# Patient Record
Sex: Female | Born: 2000 | Race: Black or African American | Hispanic: No | Marital: Single | Smoking: Never smoker
Health system: Southern US, Community
[De-identification: ages and names within clinical notes are randomized; demographics above are authoritative.]

## PROBLEM LIST (undated history)

## (undated) HISTORY — PX: CARDIAC ELECTROPHYSIOLOGY MAPPING AND ABLATION: SHX1292

---
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
## 2020-09-21 ENCOUNTER — Other Ambulatory Visit: Payer: Self-pay

## 2020-09-21 ENCOUNTER — Emergency Department (HOSPITAL_COMMUNITY)
Admission: EM | Admit: 2020-09-21 | Discharge: 2020-09-21 | Disposition: A | Discharge status: Home / Self Care | Payer: Medicaid Other | Attending: Emergency Medicine | Admitting: Emergency Medicine

## 2020-09-21 DIAGNOSIS — R000 Tachycardia, unspecified: Secondary | ICD-10-CM | POA: Diagnosis present

## 2020-09-21 DIAGNOSIS — Z20822 Contact with and (suspected) exposure to covid-19: Secondary | ICD-10-CM | POA: Insufficient documentation

## 2020-09-21 DIAGNOSIS — I471 Supraventricular tachycardia: Secondary | ICD-10-CM | POA: Insufficient documentation

## 2020-09-21 LAB — CBC WITH DIFFERENTIAL/PLATELET
Abs Immature Granulocytes: 0.01 10*3/uL (ref 0.00–0.07)
Basophils Absolute: 0 10*3/uL (ref 0.0–0.1)
Basophils Relative: 0 %
Eosinophils Absolute: 0.2 10*3/uL (ref 0.0–0.5)
Eosinophils Relative: 3 %
HCT: 30.6 % — ABNORMAL LOW (ref 36.0–46.0)
Hemoglobin: 10.2 g/dL — ABNORMAL LOW (ref 12.0–15.0)
Immature Granulocytes: 0 %
Lymphocytes Relative: 19 %
Lymphs Abs: 1.3 10*3/uL (ref 0.7–4.0)
MCH: 26.7 pg (ref 26.0–34.0)
MCHC: 33.3 g/dL (ref 30.0–36.0)
MCV: 80.1 fL (ref 80.0–100.0)
Monocytes Absolute: 0.9 10*3/uL (ref 0.1–1.0)
Monocytes Relative: 13 %
Neutro Abs: 4.6 10*3/uL (ref 1.7–7.7)
Neutrophils Relative %: 65 %
Platelets: 298 10*3/uL (ref 150–400)
RBC: 3.82 MIL/uL — ABNORMAL LOW (ref 3.87–5.11)
RDW: 14.4 % (ref 11.5–15.5)
WBC: 7 10*3/uL (ref 4.0–10.5)
nRBC: 0 % (ref 0.0–0.2)

## 2020-09-21 LAB — BASIC METABOLIC PANEL
Anion gap: 9 (ref 5–15)
BUN: 13 mg/dL (ref 6–20)
CO2: 23 mmol/L (ref 22–32)
Calcium: 8.9 mg/dL (ref 8.9–10.3)
Chloride: 107 mmol/L (ref 98–111)
Creatinine, Ser: 0.63 mg/dL (ref 0.44–1.00)
GFR, Estimated: >60 mL/min (ref >60)
Glucose, Bld: 89 mg/dL (ref 70–99)
Potassium: 3.6 mmol/L (ref 3.5–5.1)
Sodium: 139 mmol/L (ref 135–145)

## 2020-09-21 LAB — MAGNESIUM: Magnesium: 2 mg/dL (ref 1.7–2.4)

## 2020-09-21 LAB — I-STAT BETA HCG BLOOD, ED (MC, WL, AP ONLY): I-stat hCG, quantitative: <5 m[IU]/mL (ref <5)

## 2020-09-21 MED ORDER — ATENOLOL 25 MG PO TABS: 25.0000 mg | ORAL_TABLET | Freq: Every day | ORAL | 0 refills | Status: DC

## 2020-09-21 MED ORDER — ATENOLOL 25 MG PO TABS
25.0000 mg | ORAL_TABLET | Freq: Once | ORAL | Status: AC
  Administered 2020-09-21: 25 mg via ORAL
  Filled 2020-09-21: qty 1

## 2020-09-21 NOTE — ED Triage Notes (Addendum)
Pt arrived via ems due to SVT. Pt states she was pumping gas when she started to feel her heart race. Pt reports shob with no cp. Initial HR with ems 218

## 2020-09-21 NOTE — ED Provider Notes (Signed)
[facility] EMERGENCY DEPARTMENT Provider Note   CSN: [id number] Arrival date & time: 09/21/20  2035     History Chief Complaint  Patient presents with  . Tachycardia    Sabrina Ingram is a 20 y.o. female.  The history is provided by the patient and medical records.   Sabrina Ingram is a 20 y.o. female who presents to the Emergency Department complaining of palpitations. She has a history of SVT and is supposed to be on atenolol but has not taken the medication for about one week (forgot to take it).  This evening she was pumping gas when she felt palpitations with associated shortness of breath. She attempted vagal maneuvers without ability to convert herself. 911 was called. On EMS arrival her heart rate was found to be around 218. She was treated with adenocard, 6 mg followed by 12 mg. After the medication she states that her heart rate began to feel better. Her palpitations resolved. She denies any current symptoms on ED evaluation. She denies any recent illnesses. Three weeks ago she did have a covid exposure, but she tested negative one week ago. She denies any current symptoms.  She has not been vaccinated for covid 19.     No past medical history on file.  There are no problems to display for this patient.   No past surgical history on file.   OB History   No obstetric history on file.     No family history on file.     Home Medications Prior to Admission medications   Medication Sig Start Date End Date Taking? Authorizing Provider  penicillin v potassium (VEETID) 500 MG tablet Take 500 mg by mouth 4 (four) times daily.   Yes [provider]  atenolol (TENORMIN) 25 MG tablet Take 1 tablet (25 mg total) by mouth daily. 09/21/20   Tilden Fossa, MD    Allergies    Lactose intolerance (gi)  Review of Systems   Review of Systems  All other systems reviewed and are negative.   Physical Exam Updated Vital Signs BP 106/74   Pulse  (!) 113   Temp 98 F (36.7 C) (Temporal)   Resp 18   Ht 5\' 9"  (1.753 m)   Wt 67.6 kg   SpO2 100%   BMI 22.00 kg/m   Physical Exam Vitals and nursing note reviewed.  Constitutional:      Appearance: She is well-developed and well-nourished.  HENT:     Head: Normocephalic and atraumatic.  Cardiovascular:     Rate and Rhythm: Regular rhythm. Tachycardia present.     Heart sounds: No murmur heard.   Pulmonary:     Effort: Pulmonary effort is normal. No respiratory distress.     Breath sounds: Normal breath sounds.  Abdominal:     Palpations: Abdomen is soft.     Tenderness: There is no abdominal tenderness. There is no guarding or rebound.  Musculoskeletal:        General: No swelling, tenderness or edema.  Skin:    General: Skin is warm and dry.  Neurological:     Mental Status: She is alert and oriented to person, place, and time.  Psychiatric:        Mood and Affect: Mood and affect normal.        Behavior: Behavior normal.     ED Results / Procedures / Treatments   Labs (all labs ordered are listed, but only abnormal results are displayed) Labs Reviewed  CBC WITH  DIFFERENTIAL/PLATELET - Abnormal; Notable for the following components:      Result Value   RBC 3.82 (*)    Hemoglobin 10.2 (*)    HCT 30.6 (*)    All other components within normal limits  SARS CORONAVIRUS 2 (TAT 6-24 HRS)  BASIC METABOLIC PANEL  MAGNESIUM  I-STAT BETA HCG BLOOD, ED ([facility], [facility], [facility] ONLY)    EKG EKG Interpretation  Date/Time:  Monday September 21 2020 20:46:12 EST Ventricular Rate:  132 PR Interval:    QRS Duration: 88 QT Interval:  293 QTC Calculation: 435 R Axis:   92 Text Interpretation: Sinus tachycardia Borderline right axis deviation Confirmed by Tilden Fossa [phone]) on 09/21/2020 9:19:08 PM   Radiology No results found.  Procedures Procedures (including critical care time)  Medications Ordered in ED Medications  atenolol (TENORMIN) tablet 25 mg (25 mg Oral Given  09/21/20 2056)    ED Course  I have reviewed the triage vital signs and the nursing notes.  Pertinent labs & imaging results that were available during my care of the patient were reviewed by me and considered in my medical decision making (see chart for details).    MDM Rules/Calculators/A&P                          Patient with history of SVT here for evaluation of palpitations and setting of missing her atenolol for the last week. She did take her atenolol just prior to ED presentation. She converted to sinus tachycardia after EMS administered adenosine. During her ED stay she was tachycardic but remained asymptomatic. She was given a second dose of atenolol. Presentation is not consistent with PE, pneumonia, sepsis. Feel patient is stable for discharge home with outpatient follow-up and return precautions. Given her recent COVID exposure, will send a COVID-19 test. Final Clinical Impression(s) / ED Diagnoses Final diagnoses:  SVT (supraventricular tachycardia) (HCC)    Rx / DC Orders ED Discharge Orders         Ordered    atenolol (TENORMIN) 25 MG tablet  Daily        09/21/20 2207           Tilden Fossa, MD 09/21/20 2222

## 2020-09-22 LAB — SARS CORONAVIRUS 2 (TAT 6-24 HRS): SARS Coronavirus 2: NEGATIVE

## 2021-06-22 DIAGNOSIS — F411 Generalized anxiety disorder: Secondary | ICD-10-CM | POA: Diagnosis not present

## 2021-06-22 DIAGNOSIS — Z136 Encounter for screening for cardiovascular disorders: Secondary | ICD-10-CM | POA: Diagnosis not present

## 2021-06-22 DIAGNOSIS — N912 Amenorrhea, unspecified: Secondary | ICD-10-CM | POA: Diagnosis not present

## 2021-06-23 DIAGNOSIS — I471 Supraventricular tachycardia: Secondary | ICD-10-CM | POA: Diagnosis not present

## 2021-07-05 DIAGNOSIS — N912 Amenorrhea, unspecified: Secondary | ICD-10-CM | POA: Diagnosis not present

## 2021-07-05 DIAGNOSIS — Z136 Encounter for screening for cardiovascular disorders: Secondary | ICD-10-CM | POA: Diagnosis not present

## 2021-07-13 DIAGNOSIS — F410 Panic disorder [episodic paroxysmal anxiety] without agoraphobia: Secondary | ICD-10-CM | POA: Diagnosis not present

## 2021-07-13 DIAGNOSIS — N912 Amenorrhea, unspecified: Secondary | ICD-10-CM | POA: Diagnosis not present

## 2021-07-13 DIAGNOSIS — F321 Major depressive disorder, single episode, moderate: Secondary | ICD-10-CM | POA: Diagnosis not present

## 2021-07-13 DIAGNOSIS — F411 Generalized anxiety disorder: Secondary | ICD-10-CM | POA: Diagnosis not present

## 2021-07-13 DIAGNOSIS — G4700 Insomnia, unspecified: Secondary | ICD-10-CM | POA: Diagnosis not present

## 2021-07-13 DIAGNOSIS — F418 Other specified anxiety disorders: Secondary | ICD-10-CM | POA: Diagnosis not present

## 2021-07-13 DIAGNOSIS — Z0001 Encounter for general adult medical examination with abnormal findings: Secondary | ICD-10-CM | POA: Diagnosis not present

## 2021-07-15 DIAGNOSIS — E2839 Other primary ovarian failure: Secondary | ICD-10-CM | POA: Diagnosis not present

## 2021-07-15 DIAGNOSIS — N910 Primary amenorrhea: Secondary | ICD-10-CM | POA: Diagnosis not present

## 2021-07-22 DIAGNOSIS — E2839 Other primary ovarian failure: Secondary | ICD-10-CM | POA: Diagnosis not present

## 2021-09-09 DIAGNOSIS — N910 Primary amenorrhea: Secondary | ICD-10-CM | POA: Diagnosis not present

## 2021-09-09 DIAGNOSIS — Q520 Congenital absence of vagina: Secondary | ICD-10-CM | POA: Diagnosis not present

## 2021-09-09 DIAGNOSIS — E2839 Other primary ovarian failure: Secondary | ICD-10-CM | POA: Diagnosis not present

## 2021-09-09 DIAGNOSIS — Z9013 Acquired absence of bilateral breasts and nipples: Secondary | ICD-10-CM | POA: Diagnosis not present

## 2022-01-26 ENCOUNTER — Other Ambulatory Visit: Payer: Self-pay | Admitting: Nurse Practitioner

## 2022-01-26 DIAGNOSIS — I739 Peripheral vascular disease, unspecified: Secondary | ICD-10-CM

## 2022-02-03 ENCOUNTER — Other Ambulatory Visit: Payer: Medicaid Other

## 2022-02-11 ENCOUNTER — Ambulatory Visit
Admission: RE | Admit: 2022-02-11 | Discharge: 2022-02-11 | Disposition: A | Discharge status: Home / Self Care | Payer: Medicaid Other | Source: Ambulatory Visit | Attending: Nurse Practitioner | Admitting: Nurse Practitioner

## 2022-02-11 DIAGNOSIS — I739 Peripheral vascular disease, unspecified: Secondary | ICD-10-CM

## 2022-03-15 ENCOUNTER — Emergency Department (HOSPITAL_COMMUNITY): Payer: BC Managed Care – PPO

## 2022-03-15 ENCOUNTER — Emergency Department (HOSPITAL_COMMUNITY)
Admission: EM | Admit: 2022-03-15 | Discharge: 2022-03-15 | Disposition: A | Discharge status: Home / Self Care | Payer: BC Managed Care – PPO | Attending: Emergency Medicine | Admitting: Emergency Medicine

## 2022-03-15 ENCOUNTER — Encounter (HOSPITAL_COMMUNITY): Payer: Self-pay | Admitting: Emergency Medicine

## 2022-03-15 DIAGNOSIS — R002 Palpitations: Secondary | ICD-10-CM | POA: Diagnosis present

## 2022-03-15 DIAGNOSIS — I471 Supraventricular tachycardia: Secondary | ICD-10-CM | POA: Insufficient documentation

## 2022-03-15 DIAGNOSIS — D649 Anemia, unspecified: Secondary | ICD-10-CM | POA: Diagnosis not present

## 2022-03-15 DIAGNOSIS — R2990 Unspecified symptoms and signs involving the nervous system: Secondary | ICD-10-CM | POA: Diagnosis not present

## 2022-03-15 LAB — TROPONIN I (HIGH SENSITIVITY)
Troponin I (High Sensitivity): 14 ng/L (ref <18)
Troponin I (High Sensitivity): 18 ng/L — ABNORMAL HIGH (ref <18)

## 2022-03-15 LAB — I-STAT BETA HCG BLOOD, ED (MC, WL, AP ONLY): I-stat hCG, quantitative: <5 m[IU]/mL (ref <5)

## 2022-03-15 LAB — CBC
HCT: 35.8 % — ABNORMAL LOW (ref 36.0–46.0)
Hemoglobin: 11.1 g/dL — ABNORMAL LOW (ref 12.0–15.0)
MCH: 24.9 pg — ABNORMAL LOW (ref 26.0–34.0)
MCHC: 31 g/dL (ref 30.0–36.0)
MCV: 80.3 fL (ref 80.0–100.0)
Platelets: 313 10*3/uL (ref 150–400)
RBC: 4.46 MIL/uL (ref 3.87–5.11)
RDW: 14.2 % (ref 11.5–15.5)
WBC: 3.1 10*3/uL — ABNORMAL LOW (ref 4.0–10.5)
nRBC: 0 % (ref 0.0–0.2)

## 2022-03-15 LAB — BASIC METABOLIC PANEL
Anion gap: 11 (ref 5–15)
BUN: 10 mg/dL (ref 6–20)
CO2: 25 mmol/L (ref 22–32)
Calcium: 9.5 mg/dL (ref 8.9–10.3)
Chloride: 104 mmol/L (ref 98–111)
Creatinine, Ser: 0.62 mg/dL (ref 0.44–1.00)
GFR, Estimated: >60 mL/min (ref >60)
Glucose, Bld: 81 mg/dL (ref 70–99)
Potassium: 3.5 mmol/L (ref 3.5–5.1)
Sodium: 140 mmol/L (ref 135–145)

## 2022-03-15 MED ORDER — SODIUM CHLORIDE 0.9 % IV BOLUS
1000.0000 mL | Freq: Once | INTRAVENOUS | Status: AC
  Administered 2022-03-15: 1000 mL via INTRAVENOUS

## 2022-03-15 NOTE — Discharge Instructions (Signed)
Take your atenolol and iron every day.

## 2022-03-15 NOTE — ED Triage Notes (Signed)
Patient BIB [facility] from home for evaluation of shortness of breath, HR 250 on EMS arrival to home. Patient performed vagal maneuvers with EMS and HR reduced to 104. 20g saline lock in left AC. Shortness of breath resolved with change in heart rate. Patient is alert, oriented, and in no apparent distress at this time. Patient is prescribed atenolol but did not take it yesterday.

## 2022-03-15 NOTE — ED Provider Notes (Signed)
[facility] EMERGENCY DEPARTMENT Provider Note   CSN: [id number] Arrival date & time: 03/15/22  1540     History  Chief Complaint  Patient presents with   Palpitations    Sabrina Ingram is a 21 y.o. female.  Pt is a 21 year old female with a pmhx significant for SVT.  She was driving to work and felt herself go into SVT.  She called EMS and HR was 250.  She did some vagal maneuvers and HR decreased back to low 100s.  Pt feels much better now.  Pt tells me that she has had some vague numbness to her legs.  She has seen her pcp for this problem who did some blood work which showed a + ANA.  Pt was referred to rheumatology by her pcp, but said no one has ever called her for an appt.  Pcp did do ABIs which were negative.       Home Medications Prior to Admission medications   Medication Sig Start Date End Date Taking? Authorizing Provider  atenolol (TENORMIN) 25 MG tablet Take 1 tablet (25 mg total) by mouth daily. 09/21/20   Tilden Fossa, MD  penicillin v potassium (VEETID) 500 MG tablet Take 500 mg by mouth 4 (four) times daily.    [provider]      Allergies    Lactose intolerance (gi)    Review of Systems   Review of Systems  Cardiovascular:  Positive for palpitations.  All other systems reviewed and are negative.   Physical Exam Updated Vital Signs BP 101/63   Pulse 80   Temp 98.5 F (36.9 C) (Oral)   Resp (!) 21   SpO2 95%  Physical Exam Vitals and nursing note reviewed.  Constitutional:      Appearance: Normal appearance.  HENT:     Head: Normocephalic and atraumatic.     Right Ear: External ear normal.     Left Ear: External ear normal.     Nose: Nose normal.     Mouth/Throat:     Mouth: Mucous membranes are moist.     Pharynx: Oropharynx is clear.  Eyes:     Extraocular Movements: Extraocular movements intact.     Conjunctiva/sclera: Conjunctivae normal.     Pupils: Pupils are equal, round, and reactive to light.   Cardiovascular:     Rate and Rhythm: Normal rate and regular rhythm.     Pulses: Normal pulses.     Heart sounds: Normal heart sounds.  Pulmonary:     Effort: Pulmonary effort is normal.     Breath sounds: Normal breath sounds.  Abdominal:     General: Abdomen is flat. Bowel sounds are normal.     Palpations: Abdomen is soft.  Musculoskeletal:        General: Normal range of motion.     Cervical back: Normal range of motion and neck supple.  Skin:    General: Skin is warm.     Capillary Refill: Capillary refill takes less than 2 seconds.  Neurological:     General: No focal deficit present.     Mental Status: She is alert and oriented to person, place, and time.  Psychiatric:        Mood and Affect: Mood normal.        Behavior: Behavior normal.     ED Results / Procedures / Treatments   Labs (all labs ordered are listed, but only abnormal results are displayed) Labs Reviewed  CBC -  Abnormal; Notable for the following components:      Result Value   WBC 3.1 (*)    Hemoglobin 11.1 (*)    HCT 35.8 (*)    MCH 24.9 (*)    All other components within normal limits  TROPONIN I (HIGH SENSITIVITY) - Abnormal; Notable for the following components:   Troponin I (High Sensitivity) 18 (*)    All other components within normal limits  BASIC METABOLIC PANEL  I-STAT BETA HCG BLOOD, ED ([facility], [facility], [facility] ONLY)  TROPONIN I (HIGH SENSITIVITY)    EKG EKG Interpretation  Date/Time:  Tuesday March 15 2022 09:37:16 EDT Ventricular Rate:  101 PR Interval:  190 QRS Duration: 86 QT Interval:  318 QTC Calculation: 412 R Axis:   72 Text Interpretation: Sinus tachycardia Otherwise normal ECG When compared with ECG of 21-Sep-2020 20:46, PREVIOUS ECG IS PRESENT Since last tracing rate slower Confirmed by Jacalyn Lefevre [phone]) on 03/15/2022 2:17:02 PM  Radiology DG Chest 2 View  Result Date: 03/15/2022 CLINICAL DATA:  Chest pain EXAM: CHEST - 2 VIEW COMPARISON:  06/14/2018 FINDINGS: Normal  heart size, mediastinal contours, and pulmonary vascularity. Minimal chronic peribronchial thickening unchanged. Lungs clear. No pulmonary infiltrate, pleural effusion or pneumothorax. Osseous structures unremarkable. IMPRESSION: No acute abnormalities. Electronically Signed   By: Ulyses Southward M.D.   On: 03/15/2022 10:11    Procedures Procedures    Medications Ordered in ED Medications  sodium chloride 0.9 % bolus 1,000 mL (0 mLs Intravenous Stopped 03/15/22 1356)    ED Course/ Medical Decision Making/ A&P                           Medical Decision Making Amount and/or Complexity of Data Reviewed Labs: ordered. Radiology: ordered.   This patient presents to the ED for concern of palpitations, this involves an extensive number of treatment options, and is a complaint that carries with it a high risk of complications and morbidity.  The differential diagnosis includes svt, electrolyte abn, pregnancy   Co morbidities that complicate the patient evaluation  svt   Additional history obtained:  Additional history obtained from epic chart review    Lab Tests:  I Ordered, and personally interpreted labs.  The pertinent results include:  cbc with wbc 3.1, hgb 11.1 (last hgb 10); preg <5   Imaging Studies ordered:  I ordered imaging studies including CXR  I independently visualized and interpreted imaging which showed  IMPRESSION:  No acute abnormalities.   I agree with the radiologist interpretation   Cardiac Monitoring:  The patient was maintained on a cardiac monitor.  I personally viewed and interpreted the cardiac monitored which showed an underlying rhythm of: st.  Pt's hr is now down to the 80s.   Medicines ordered and prescription drug management:  I ordered medication including ivfs  for svt  Reevaluation of the patient after these medicines showed that the patient improved I have reviewed the patients home medicines and have made adjustments as  needed    Critical Interventions:  ivfs    Problem List / ED Course:  SVT:  resolved with vagal maneuvers.  The pt's HR is now in the mid-80s.  She admits to forgetting to take her atenolol yesterday. Pt said she has a cardiology appt in August. +ANA:  pt re-referred to rheumatology   Reevaluation:  After the interventions noted above, I reevaluated the patient and found that they have :improved   Social Determinants of  Health:  Lives at home   Dispostion:  After consideration of the diagnostic results and the patients response to treatment, I feel that the patent would benefit from discharge with outpatient f/u.          Final Clinical Impression(s) / ED Diagnoses Final diagnoses:  SVT (supraventricular tachycardia) (HCC)  Anemia, unspecified type    Rx / DC Orders ED Discharge Orders          Ordered    Ambulatory referral to Rheumatology        03/15/22 1350              Jacalyn Lefevre, MD 03/15/22 1423

## 2022-03-16 ENCOUNTER — Emergency Department (HOSPITAL_COMMUNITY): Payer: BC Managed Care – PPO

## 2022-03-16 ENCOUNTER — Emergency Department (HOSPITAL_COMMUNITY)
Admission: EM | Admit: 2022-03-16 | Discharge: 2022-03-16 | Disposition: A | Discharge status: Home / Self Care | Payer: BC Managed Care – PPO | Attending: Emergency Medicine | Admitting: Emergency Medicine

## 2022-03-16 ENCOUNTER — Other Ambulatory Visit: Payer: Self-pay

## 2022-03-16 DIAGNOSIS — R2990 Unspecified symptoms and signs involving the nervous system: Secondary | ICD-10-CM | POA: Diagnosis not present

## 2022-03-16 DIAGNOSIS — R202 Paresthesia of skin: Secondary | ICD-10-CM | POA: Diagnosis present

## 2022-03-16 LAB — COMPREHENSIVE METABOLIC PANEL
ALT: 12 U/L (ref 0–44)
AST: 20 U/L (ref 15–41)
Albumin: 3.8 g/dL (ref 3.5–5.0)
Alkaline Phosphatase: 67 U/L (ref 38–126)
Anion gap: 8 (ref 5–15)
BUN: 8 mg/dL (ref 6–20)
CO2: 25 mmol/L (ref 22–32)
Calcium: 9.1 mg/dL (ref 8.9–10.3)
Chloride: 106 mmol/L (ref 98–111)
Creatinine, Ser: 0.59 mg/dL (ref 0.44–1.00)
GFR, Estimated: >60 mL/min (ref >60)
Glucose, Bld: 93 mg/dL (ref 70–99)
Potassium: 3.6 mmol/L (ref 3.5–5.1)
Sodium: 139 mmol/L (ref 135–145)
Total Bilirubin: 0.2 mg/dL — ABNORMAL LOW (ref 0.3–1.2)
Total Protein: 7.7 g/dL (ref 6.5–8.1)

## 2022-03-16 LAB — PROTIME-INR
INR: 1.1 (ref 0.8–1.2)
Prothrombin Time: 14.3 seconds (ref 11.4–15.2)

## 2022-03-16 LAB — I-STAT CHEM 8, ED
BUN: 5 mg/dL — ABNORMAL LOW (ref 6–20)
Calcium, Ion: 1.14 mmol/L — ABNORMAL LOW (ref 1.15–1.40)
Chloride: 108 mmol/L (ref 98–111)
Creatinine, Ser: 0.4 mg/dL — ABNORMAL LOW (ref 0.44–1.00)
Glucose, Bld: 76 mg/dL (ref 70–99)
HCT: 29 % — ABNORMAL LOW (ref 36.0–46.0)
Hemoglobin: 9.9 g/dL — ABNORMAL LOW (ref 12.0–15.0)
Potassium: 3.3 mmol/L — ABNORMAL LOW (ref 3.5–5.1)
Sodium: 143 mmol/L (ref 135–145)
TCO2: 22 mmol/L (ref 22–32)

## 2022-03-16 LAB — APTT: aPTT: 30 seconds (ref 24–36)

## 2022-03-16 LAB — DIFFERENTIAL
Abs Immature Granulocytes: 0.01 10*3/uL (ref 0.00–0.07)
Basophils Absolute: 0 10*3/uL (ref 0.0–0.1)
Basophils Relative: 1 %
Eosinophils Absolute: 0.2 10*3/uL (ref 0.0–0.5)
Eosinophils Relative: 5 %
Immature Granulocytes: 0 %
Lymphocytes Relative: 30 %
Lymphs Abs: 1.2 10*3/uL (ref 0.7–4.0)
Monocytes Absolute: 0.5 10*3/uL (ref 0.1–1.0)
Monocytes Relative: 14 %
Neutro Abs: 1.9 10*3/uL (ref 1.7–7.7)
Neutrophils Relative %: 50 %

## 2022-03-16 LAB — CBC
HCT: 32.7 % — ABNORMAL LOW (ref 36.0–46.0)
Hemoglobin: 9.9 g/dL — ABNORMAL LOW (ref 12.0–15.0)
MCH: 24.6 pg — ABNORMAL LOW (ref 26.0–34.0)
MCHC: 30.3 g/dL (ref 30.0–36.0)
MCV: 81.1 fL (ref 80.0–100.0)
Platelets: 272 10*3/uL (ref 150–400)
RBC: 4.03 MIL/uL (ref 3.87–5.11)
RDW: 14.4 % (ref 11.5–15.5)
WBC: 3.8 10*3/uL — ABNORMAL LOW (ref 4.0–10.5)
nRBC: 0 % (ref 0.0–0.2)

## 2022-03-16 LAB — CBG MONITORING, ED
Glucose-Capillary: 91 mg/dL (ref 70–99)
Glucose-Capillary: 44 mg/dL — CL (ref 70–99)
Glucose-Capillary: 48 mg/dL — ABNORMAL LOW (ref 70–99)
Glucose-Capillary: 67 mg/dL — ABNORMAL LOW (ref 70–99)

## 2022-03-16 LAB — I-STAT BETA HCG BLOOD, ED (MC, WL, AP ONLY): I-stat hCG, quantitative: <5 m[IU]/mL (ref <5)

## 2022-03-16 LAB — ETHANOL: Alcohol, Ethyl (B): <10 mg/dL (ref <10)

## 2022-03-16 MED ORDER — IOHEXOL 350 MG/ML SOLN
50.0000 mL | Freq: Once | INTRAVENOUS | Status: AC | PRN
  Administered 2022-03-16: 50 mL via INTRAVENOUS

## 2022-03-16 MED ORDER — GADOBUTROL 1 MMOL/ML IV SOLN
7.0000 mL | Freq: Once | INTRAVENOUS | Status: AC | PRN
  Administered 2022-03-16: 7 mL via INTRAVENOUS

## 2022-03-16 MED ORDER — SODIUM CHLORIDE 0.9% FLUSH: 3.0000 mL | Freq: Once | INTRAVENOUS | Status: DC

## 2022-03-16 NOTE — Consult Note (Signed)
Neurology Consultation  Reason for Consult: Code stroke for right-sided numbness Referring Physician: Dr. Gwyneth Sprout  CC: Code stroke with right-sided numbness in the head, face, arm and leg  History is obtained from: Chart review, patient  HPI: Valincia Touch is a 21 y.o. female past medical history of PSVT, anxiety, Raynaud's phenomenon, peripheral vascular disease, oligomenorrhea, presenting for evaluation of sudden onset of heaviness and numbness that started in the right head and proceeded to involve the whole right hemibody.  She works in an office.  Reports she was at her desk when she had a sudden onset of right head heaviness and numbness which then went on to include the arm and leg.  No history of strokes-personal or family. Grandfather had a heart attack at the age of 99.  No bleeding or clotting disorders in the family. She was seen by her primary care provider for ankle swelling and tingling and numbness in her fingertips and toes and was worked up-work-up revealed positive ANA-was referred to rheumatology but not seen yet. Denies any current visual symptoms, headache, chest pain, shortness of breath.  PMH including SVT on atenolol 25mg  daily. She was seen in the ED yesterday after an episode of SVT and nonspecific leg numbness.    LKW: 1245 IV thrombolysis given?: no, less likely stroke, low NIH Premorbid modified Rankin scale (mRS): 0  ROS: Full ROS was performed and is negative except as noted in the HPI.   No past medical history on file.  Family history Heart attack in grandfather in his 97s.    Social History:   has no history on file for tobacco use, alcohol use, and drug use.   Medications  Current Facility-Administered Medications:    sodium chloride flush (NS) 0.9 % injection 3 mL, 3 mL, Intravenous, Once, Plunkett, Whitney, MD  Current Outpatient Medications:    atenolol (TENORMIN) 25 MG tablet, Take 1 tablet (25 mg total) by mouth daily.,  Disp: 30 tablet, Rfl: 0   penicillin v potassium (VEETID) 500 MG tablet, Take 500 mg by mouth 4 (four) times daily., Disp: , Rfl:    Exam: VSS General: Awake alert in no distress HD: Normocephalic atraumatic Lungs clear Cardiovascular: Regular rhythm Abdomen nondistended nontender Extremities warm well perfused Neurologic exam Awake alert oriented x3 No aphasia No dysarthria Cranial nerves II to XII intact with the exception of diminished sensation to light touch in the V1 and V3 territory on the right, normal on the left, intact facial symmetry, intact hearing, palate and tongue midline. Motor examination with no drift in any fours Sensory examination with diminished sensation on the right arm and leg compared to the left. Coordination with no dysmetria NIHSS  Stroke scale-1  Labs I have reviewed labs in epic and the results pertinent to this consultation are: CBC    Component Value Date/Time   WBC 3.8 (L) 03/16/2022 1402   RBC 4.03 03/16/2022 1402   HGB 9.9 (L) 03/16/2022 1402   HCT 32.7 (L) 03/16/2022 1402   PLT 272 03/16/2022 1402   MCV 81.1 03/16/2022 1402   MCH 24.6 (L) 03/16/2022 1402   MCHC 30.3 03/16/2022 1402   RDW 14.4 03/16/2022 1402   LYMPHSABS 1.2 03/16/2022 1402   MONOABS 0.5 03/16/2022 1402   EOSABS 0.2 03/16/2022 1402   BASOSABS 0.0 03/16/2022 1402   CMP     Component Value Date/Time   NA 139 03/16/2022 1402   K 3.6 03/16/2022 1402   CL 106 03/16/2022 1402   CO2  25 03/16/2022 1402   GLUCOSE 93 03/16/2022 1402   BUN 8 03/16/2022 1402   CREATININE 0.59 03/16/2022 1402   CALCIUM 9.1 03/16/2022 1402   PROT 7.7 03/16/2022 1402   ALBUMIN 3.8 03/16/2022 1402   AST 20 03/16/2022 1402   ALT 12 03/16/2022 1402   ALKPHOS 67 03/16/2022 1402   BILITOT 0.2 (L) 03/16/2022 1402   GFRNONAA >60 03/16/2022 1402   GFRAA NOT CALCULATED 02/14/2019 0021      Imaging I have reviewed the images obtained:  CT-head: No acute changes CTA head and neck: No LVO  or vascular malformation CTV: No evidence of dural venous sinus thrombus  MRI examination of the brain-ordered and pending  Assessment: 21 year old woman with above past medical history presenting for sudden onset of right-sided head heaviness, numbness on the right face arm and leg with no headache preceding or after.  Has a history of PSVT, anxiety and Raynaud's phenomenon. Has been worked up as an outpatient for some joint related issues as well as numbness of fingers and has a positive ANA-unclear if has a definitive lupus diagnosis. Given the hypercoagulability associated with autoimmune disorders-to rule out any kind of clotting, I proceeded with ordering vascular imaging for evaluation of the arteries as well as dural venous sinuses to rule out dural venous sinus thrombus. CT head, CT angiography head and neck and CT venogram are negative. She would benefit from a MRI of the head to rule out a structural lesion. Differentials at this time-stroke is very low in the differentials, complex migraine versus anxiety causing paresthesias is more likely.  Recommendations: Stat MRI of the brain  Addendum Stat MRI of the brain within limitations of artifact from braces negative for acute process.  Personally reviewed. Needs outpatient rheumatology and neurology follow-up. Can treat symptomatic head heaviness with migraine cocktail. Also of note-CBG on arrival was 44.  Symptoms could simply reflect sequela of hypoglycemia.  Plan discussed with Luther Hearing PA-C, EDP  No further inpatient work-up at this time  -- Milon Dikes, MD Neurologist [facility] Pager: [phone]

## 2022-03-16 NOTE — ED Provider Notes (Signed)
[facility] EMERGENCY DEPARTMENT Provider Note   CSN: [id number] Arrival date & time: 03/16/22  1358     History  Chief Complaint  Patient presents with   Code Stroke    Sabrina Ingram is a 21 y.o. female. I received this patient in handoff, however the previous provider has not had a chance to lay eyes on this patient due to the patient being declared a code stroke. At time of my handoff patient with unremarkable CT Head, CTA head / neck. Pending MRI.  History: This is a female with past medical history significant for PSVT, anxiety, Raynaud's phenomenon.  Additionally with recent previous diagnosis of positive ANA.  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  Patient was seen evaluated yesterday for SVT, with some vague neurologic symptoms at that time as well which she reports are resolved.  She reports that when she was sitting at work this morning she had not had very much to eat, and had some coffee to drink.  She reports that coffee often makes her feel very odd.  We have obtained CT head, CT angio head and neck, CT venogram as well as MR with and without on recommendations from neurology.  On my evaluation of patient she reports complete resolution of symptoms and reports that she feels overall well.  She denies any headache, vision changes, numbness, tingling, facial droop, confusion, dizziness, lightheadedness.  She reports that the right sided heaviness she felt earlier has completely resolved.  She denies any trauma or injury to the head.  No previous history of stroke.  No previous history of DVT or PE but some concern for hypercoagulability in context of possible lupus.  HPI     Home Medications Prior to Admission medications   Medication Sig Start Date End Date Taking? Authorizing Provider  atenolol (TENORMIN) 25 MG tablet Take 1 tablet (25 mg total) by mouth daily. 09/21/20   Tilden Fossa, MD  penicillin v potassium  (VEETID) 500 MG tablet Take 500 mg by mouth 4 (four) times daily.    [provider]      Allergies    Lactose intolerance (gi)    Review of Systems   Review of Systems  Neurological:  Positive for numbness.  All other systems reviewed and are negative.   Physical Exam Updated Vital Signs BP 105/66   Pulse 78   Resp (!) 21   Ht 5\' 9"  (1.753 m)   Wt 70 kg   SpO2 100%   BMI 22.79 kg/m  Physical Exam Vitals and nursing note reviewed.  Constitutional:      General: She is not in acute distress.    Appearance: Normal appearance.  HENT:     Head: Normocephalic and atraumatic.  Eyes:     General:        Right eye: No discharge.        Left eye: No discharge.  Cardiovascular:     Rate and Rhythm: Normal rate and regular rhythm.     Heart sounds: No murmur heard.    No friction rub. No gallop.  Pulmonary:     Effort: Pulmonary effort is normal.     Breath sounds: Normal breath sounds.  Abdominal:     General: Bowel sounds are normal.     Palpations: Abdomen is soft.  Skin:    General: Skin is warm and dry.     Capillary Refill: Capillary refill takes less than 2 seconds.  Neurological:     Mental Status: She is alert and oriented to person, place, and time.     Comments: Cranial nerves II through XII grossly intact.  Intact finger-nose, intact heel-to-shin.  Romberg negative, gait normal.  Alert and oriented x3.  Moves all 4 limbs spontaneously, normal coordination.  No pronator drift.  Intact strength 5 out of 5 bilateral upper and lower extremities.    Psychiatric:        Mood and Affect: Mood normal.        Behavior: Behavior normal.     ED Results / Procedures / Treatments   Labs (all labs ordered are listed, but only abnormal results are displayed) Labs Reviewed  CBC - Abnormal; Notable for the following components:      Result Value   WBC 3.8 (*)    Hemoglobin 9.9 (*)    HCT 32.7 (*)    MCH 24.6 (*)    All other components within normal limits   COMPREHENSIVE METABOLIC PANEL - Abnormal; Notable for the following components:   Total Bilirubin 0.2 (*)    All other components within normal limits  I-STAT CHEM 8, ED - Abnormal; Notable for the following components:   Potassium 3.3 (*)    BUN 5 (*)    Creatinine, Ser 0.40 (*)    Calcium, Ion 1.14 (*)    Hemoglobin 9.9 (*)    HCT 29.0 (*)    All other components within normal limits  CBG MONITORING, ED - Abnormal; Notable for the following components:   Glucose-Capillary 48 (*)    All other components within normal limits  CBG MONITORING, ED - Abnormal; Notable for the following components:   Glucose-Capillary 44 (*)    All other components within normal limits  CBG MONITORING, ED - Abnormal; Notable for the following components:   Glucose-Capillary 67 (*)    All other components within normal limits  PROTIME-INR  APTT  DIFFERENTIAL  ETHANOL  CBG MONITORING, ED  I-STAT BETA HCG BLOOD, ED ([facility], [facility], [facility] ONLY)    EKG None  Radiology MR BRAIN W WO CONTRAST  Result Date: 03/16/2022 EXAM: MRI HEAD WITHOUT AND WITH CONTRAST TECHNIQUE: Multiplanar, multiecho pulse sequences of the brain and surrounding structures were obtained without and with intravenous contrast. CONTRAST:  75mL GADAVIST GADOBUTROL 1 MMOL/ML IV SOLN COMPARISON:  None Available. FINDINGS: Limited study due to artifact from the patient's braces. A large portion of the anterior frontal lobes, basal ganglia, brainstem, anterior temporal lobes, and posterior fossa is obscured on diffusion-weighted and susceptibility weighted imaging. Within this limitation: Brain: No visible evidence of acute intracranial abnormality within limitation described above. No visible acute hemorrhage, mass lesion, midline shift, hydrocephalus, or extra-axial fluid collection. No visible pathologic enhancement. Visualized dural venous sinuses are patent. Vascular: Major arterial flow voids are maintained at the skull base. Skull and upper cervical  spine: Normal marrow signal. Sinuses/Orbits: Limited visualization. Opacified right frontal sinus. No acute orbital findings. Other: No mastoid effusions. IMPRESSION: Limited study due to artifact from the patient's braces without visible acute abnormality. Electronically Signed   By: Feliberto Harts M.D.   On: 03/16/2022 15:21   CT HEAD CODE STROKE WO CONTRAST  Result Date: 03/16/2022 CLINICAL DATA:  Code stroke. Neuro deficit, acute, stroke suspected. EXAM: CT ANGIOGRAPHY HEAD AND NECK TECHNIQUE: Multidetector CT imaging of the head and neck was performed using the standard protocol during bolus administration of intravenous contrast. Multiplanar CT image reconstructions and MIPs were obtained to evaluate the  vascular anatomy. Carotid stenosis measurements (when applicable) are obtained utilizing NASCET criteria, using the distal internal carotid diameter as the denominator. RADIATION DOSE REDUCTION: This exam was performed according to the departmental dose-optimization program which includes automated exposure control, adjustment of the mA and/or kV according to patient size and/or use of iterative reconstruction technique. CONTRAST:  Administered contrast not known at this time. COMPARISON:  No pertinent prior exams available for comparison. FINDINGS: CT HEAD FINDINGS Brain: Cerebral volume is normal. There is no acute intracranial hemorrhage. No demarcated cortical infarct. No extra-axial fluid collection. No evidence of an intracranial mass. No midline shift. Vascular: No hyperdense vessel. Skull: No fracture or aggressive osseous lesion. Sinuses: Opacification the right frontal sinus. Frothy secretions within the right frontoethmoidal recess. Minimal mucosal thickening within the right maxillary sinus. Orbits: No orbital mass or acute orbital finding. ASPECTS (Alberta Stroke Program Early CT Score) - Ganglionic level infarction (caudate, lentiform nuclei, internal capsule, insula, M1-M3 cortex): 7 -  Supraganglionic infarction (M4-M6 cortex): 3 Total score (0-10 with 10 being normal): 10 These results were communicated to Dr. Arora at 2:27 pmon 7/12/2023by text page via [facility] messaging system. Review of the MIP images confirms the above findings CTA NECK FINDINGS Aortic arch: Standard aortic branching. The visualized aortic arch is normal in caliber. No hemodynamically significant innominate or proximal subclavian artery stenosis. Right carotid system: CCA and ICA patent within the neck without stenosis or evidence of dissection. Left carotid system: CCA and ICA patent within the neck without stenosis or evidence of dissection. Vertebral arteries: Vertebral arteries patent within the neck without stenosis or evidence of dissection. Skeleton: No acute bony abnormality or aggressive osseous lesion. Other neck: No neck mass or cervical lymphadenopathy. Upper chest: No consolidation within the imaged lung apices. Review of the MIP images confirms the above findings CTA HEAD FINDINGS Anterior circulation: The intracranial internal carotid arteries are patent. The M1 middle cerebral arteries are patent. No M2 proximal branch occlusion or high-grade proximal stenosis. The anterior cerebral arteries are patent. No intracranial aneurysm is identified. Posterior circulation: The intracranial vertebral arteries are patent. The basilar artery is patent. The posterior cerebral arteries are patent. A small right posterior communicating artery is present. The left posterior communicating artery is diminutive or absent. Venous sinuses: Within the limitations of contrast timing, no convincing thrombus. Anatomic variants: As described. Review of the MIP images confirms the above findings No emergent large vessel occlusion identified. These results were communicated to Dr. Wilford Corner at 2:39 pm on 7/12/2023by text page via the [facility] messaging system. IMPRESSION: CT head: 1. No evidence of acute intracranial abnormality. 2.  Paranasal sinus disease, as described. CTA neck: The common carotid, internal carotid and vertebral arteries are patent within the neck without stenosis or evidence of dissection. CTA head: No intracranial large vessel occlusion or proximal high-grade arterial stenosis. Electronically Signed   By: Jackey Loge D.O.   On: 03/16/2022 14:40   CT ANGIO HEAD NECK W WO CM (CODE STROKE)  Result Date: 03/16/2022 CLINICAL DATA:  Code stroke. Neuro deficit, acute, stroke suspected. EXAM: CT ANGIOGRAPHY HEAD AND NECK TECHNIQUE: Multidetector CT imaging of the head and neck was performed using the standard protocol during bolus administration of intravenous contrast. Multiplanar CT image reconstructions and MIPs were obtained to evaluate the vascular anatomy. Carotid stenosis measurements (when applicable) are obtained utilizing NASCET criteria, using the distal internal carotid diameter as the denominator. RADIATION DOSE REDUCTION: This exam was performed according to the departmental dose-optimization program which includes  automated exposure control, adjustment of the mA and/or kV according to patient size and/or use of iterative reconstruction technique. CONTRAST:  Administered contrast not known at this time. COMPARISON:  No pertinent prior exams available for comparison. FINDINGS: CT HEAD FINDINGS Brain: Cerebral volume is normal. There is no acute intracranial hemorrhage. No demarcated cortical infarct. No extra-axial fluid collection. No evidence of an intracranial mass. No midline shift. Vascular: No hyperdense vessel. Skull: No fracture or aggressive osseous lesion. Sinuses: Opacification the right frontal sinus. Frothy secretions within the right frontoethmoidal recess. Minimal mucosal thickening within the right maxillary sinus. Orbits: No orbital mass or acute orbital finding. ASPECTS (Alberta Stroke Program Early CT Score) - Ganglionic level infarction (caudate, lentiform nuclei, internal capsule, insula,  M1-M3 cortex): 7 - Supraganglionic infarction (M4-M6 cortex): 3 Total score (0-10 with 10 being normal): 10 These results were communicated to Dr. Wilford CornerArora at 2:27 pmon 7/12/2023by text page via the [facility] messaging system. Review of the MIP images confirms the above findings CTA NECK FINDINGS Aortic arch: Standard aortic branching. The visualized aortic arch is normal in caliber. No hemodynamically significant innominate or proximal subclavian artery stenosis. Right carotid system: CCA and ICA patent within the neck without stenosis or evidence of dissection. Left carotid system: CCA and ICA patent within the neck without stenosis or evidence of dissection. Vertebral arteries: Vertebral arteries patent within the neck without stenosis or evidence of dissection. Skeleton: No acute bony abnormality or aggressive osseous lesion. Other neck: No neck mass or cervical lymphadenopathy. Upper chest: No consolidation within the imaged lung apices. Review of the MIP images confirms the above findings CTA HEAD FINDINGS Anterior circulation: The intracranial internal carotid arteries are patent. The M1 middle cerebral arteries are patent. No M2 proximal branch occlusion or high-grade proximal stenosis. The anterior cerebral arteries are patent. No intracranial aneurysm is identified. Posterior circulation: The intracranial vertebral arteries are patent. The basilar artery is patent. The posterior cerebral arteries are patent. A small right posterior communicating artery is present. The left posterior communicating artery is diminutive or absent. Venous sinuses: Within the limitations of contrast timing, no convincing thrombus. Anatomic variants: As described. Review of the MIP images confirms the above findings No emergent large vessel occlusion identified. These results were communicated to Dr. Wilford CornerArora at 2:39 pm on 7/12/2023by text page via the [facility] messaging system. IMPRESSION: CT head: 1. No evidence of acute intracranial  abnormality. 2. Paranasal sinus disease, as described. CTA neck: The common carotid, internal carotid and vertebral arteries are patent within the neck without stenosis or evidence of dissection. CTA head: No intracranial large vessel occlusion or proximal high-grade arterial stenosis. Electronically Signed   By: Jackey LogeKyle  Golden D.O.   On: 03/16/2022 14:40   CT VENOGRAM HEAD  Result Date: 03/16/2022 CLINICAL DATA:  Headache, migraine EXAM: CT VENOGRAM HEAD TECHNIQUE: Venographic phase images of the brain were obtained following the administration of intravenous contrast. Multiplanar reformats and maximum intensity projections were generated. RADIATION DOSE REDUCTION: This exam was performed according to the departmental dose-optimization program which includes automated exposure control, adjustment of the mA and/or kV according to patient size and/or use of iterative reconstruction technique. CONTRAST:  50mL OMNIPAQUE IOHEXOL 350 MG/ML SOLN COMPARISON:  None Available. FINDINGS: Superior sagittal sinus, straight sinus, vein of Galen, and internal cerebral veins are patent. Both transverse and sigmoid sinuses are patent. No abnormal brain parenchymal enhancement. IMPRESSION: No evidence of venous thrombosis. Electronically Signed   By: Guadlupe SpanishPraneil  Patel M.D.   On: 03/16/2022 14:34  DG Chest 2 View  Result Date: 03/15/2022 CLINICAL DATA:  Chest pain EXAM: CHEST - 2 VIEW COMPARISON:  06/14/2018 FINDINGS: Normal heart size, mediastinal contours, and pulmonary vascularity. Minimal chronic peribronchial thickening unchanged. Lungs clear. No pulmonary infiltrate, pleural effusion or pneumothorax. Osseous structures unremarkable. IMPRESSION: No acute abnormalities. Electronically Signed   By: Ulyses Southward M.D.   On: 03/15/2022 10:11    Procedures Procedures    Medications Ordered in ED Medications  sodium chloride flush (NS) 0.9 % injection 3 mL (has no administration in time range)  iohexol (OMNIPAQUE) 350 MG/ML  injection 50 mL (50 mLs Intravenous Contrast Given 03/16/22 1427)  iohexol (OMNIPAQUE) 350 MG/ML injection 50 mL (50 mLs Intravenous Contrast Given 03/16/22 1429)  gadobutrol (GADAVIST) 1 MMOL/ML injection 7 mL (7 mLs Intravenous Contrast Given 03/16/22 1504)    ED Course/ Medical Decision Making/ A&P Clinical Course as of 03/16/22 1656  Wed Mar 16, 2022  1503 Headache, nonlocalizing sensory deficits, became code stroke -- is getting MRI at this time to look for MS or anything else going on. Previous positive ANA. [facility] will touch base based on what else may need to be done. Currently receiving MRI. [CP]  1545 Glucose-Capillary(!!): 44 [CP]  1547 Arora-- outpatient rheum / neuro follow up. No other recommendations. We will correct hypoglycmia and then dc home. [CP]    Clinical Course User Index [CP] Olene Floss, PA-C                           Medical Decision Making Amount and/or Complexity of Data Reviewed Labs: ordered. Radiology: ordered.   This patient is a 21 y.o. female who presents to the ED for concern of right-sided head heaviness, right arm paresthesia, this involves an extensive number of treatment options, and is a complaint that carries with it a high risk of complications and morbidity. The emergent differential diagnosis prior to evaluation includes, but is not limited to, stroke, intracranial mass, MS presentation, dural venous thrombosis, migraine, anxiety, near syncope versus other.   This is not an exhaustive differential.   Past Medical History / Co-morbidities / Social History: PSVT, anxiety, Raynaud's phenomenon.  Additionally with recent previous diagnosis of positive ANA  Additional history: Chart reviewed. Pertinent results include: reviewed labwork, ekg from patient evaluation yesterday  Physical Exam: Physical exam performed. The pertinent findings include: no neurologic deficits other other abnormalities at this time  Lab Tests: I ordered, and  personally interpreted labs.  The pertinent results include:  anemia at 9.9 worse compared to baseline from yesterday. Patient received IV fluid bolus yesterday and has received a fair amount of blood draw since last evaluation. No other sources of possible blood loss identified. Patient with no report of dark or tarry stool, hematemesis, hematochezia. She has amenorrhea. Patient with hypoglycemia on recheck -- nothing on her stomach. Patient has had little to eat today. Was normoglycemic on arrival. We will perform swallow screen and encourage oral intake at this time. Patient with normal mentation during CBG eval. patient with significant improvement of CBG after oral replenishment, repeat at 67, still on the low side, but patient continues to [facility] appropriately and CBG uptrending.  Patient encouraged to eat regularly.   Imaging Studies: I ordered imaging studies including CT venogram head, CT angio head and neck w wo, ct head wo, mr brain w wo. I independently visualized and interpreted imaging which showed no acute intracranial abnormalities. MR somewhat limited due to  patient's braces. I agree with the radiologist interpretation.   Cardiac Monitoring:  The patient was maintained on a cardiac monitor.  My attending physician Dr. Criss Alvine viewed and interpreted the cardiac monitored which showed an underlying rhythm of: NSR, borderline prolonged PR. No complete heart block or other arrhythmia noted. I agree with this interpretation.   Consultations Obtained: I requested consultation with the neurologist, spoke with Dr. Wilford Corner,  and discussed lab and imaging findings as well as pertinent plan - they recommend: discharge with rheum and neuro follow ups.   Disposition: After consideration of the diagnostic results and the patients response to treatment, I feel that patient is stable for discharge at this time.Marland Kitchen   emergency department workup does not suggest an emergent condition requiring admission  or immediate intervention beyond what has been performed at this time. The plan is: follow up with neurology, rheumatology. The patient is safe for discharge and has been instructed to return immediately for worsening symptoms, change in symptoms or any other concerns.  I discussed this case with my attending physician Dr. Criss Alvine who cosigned this note including patient's presenting symptoms, physical exam, and planned diagnostics and interventions. Attending physician stated agreement with plan or made changes to plan which were implemented.    Final Clinical Impression(s) / ED Diagnoses Final diagnoses:  Paresthesias    Rx / DC Orders ED Discharge Orders     None         Olene Floss, PA-C 03/16/22 1656    Pricilla Loveless, MD 03/21/22 [phone]

## 2022-03-16 NOTE — Discharge Instructions (Addendum)
I recommend eating a balanced breakfast, lunch, dinner.  Please monitor for any return of headache, numbness, tingling, dizziness, blurred vision, difficulty with balance, confusion.  Continue taking your normal at home medications.  Please follow-up with neurology and rheumatology to discuss your positive ANA (like we discussed, this can indicate that you may have Lupus or other rheumatologic diseases) and these nonspecific neurologic findings.  Your work-up today was overall unremarkable and reassuring.  You did have a low blood sugar which is likely secondary to the fact that you had nothing to eat all day.  Please return if your symptoms significantly worsen, you develop severe persistent headache that does not respond to over-the-counter pain medication, or if you begin to have chest pain, shortness of breath, or significant fever, chills.

## 2022-03-16 NOTE — ED Triage Notes (Signed)
Pt arrived from work via [employer]. At 1245pm today she was sitting at her desk when she felt a weird sensation on the right side of her head and right arm. Pt c/o of sensation feeling very heavy on her right side.   No weakness, no drift, no facial droops were observed. Symptoms seemed controlled upon arrival.   Pt has an 18g in RAC   Last VS  104/70 HR 86  R 18  97% on RA

## 2022-04-21 ENCOUNTER — Encounter: Payer: Self-pay | Admitting: Internal Medicine

## 2022-04-21 ENCOUNTER — Ambulatory Visit (INDEPENDENT_AMBULATORY_CARE_PROVIDER_SITE_OTHER): Payer: BC Managed Care – PPO | Admitting: Internal Medicine

## 2022-04-21 VITALS — BP 95/60 | HR 75 | Ht 71.0 in | Wt 152.2 lb

## 2022-04-21 DIAGNOSIS — R002 Palpitations: Secondary | ICD-10-CM | POA: Diagnosis not present

## 2022-04-21 NOTE — Patient Instructions (Signed)
Medication Instructions:  No Changes In Medications at this time.  *If you need a refill on your cardiac medications before your next appointment, please call your pharmacy*  Lab Work: None Ordered At This Time.  If you have labs (blood work) drawn today and your tests are completely normal, you will receive your results only by: MyChart Message (if you have MyChart) OR A paper copy in the mail If you have any lab test that is abnormal or we need to change your treatment, we will call you to review the results.  Testing/Procedures: None Ordered At This Time.   Follow-Up: At [facility], you and your health needs are our priority.  As part of our continuing mission to provide you with exceptional heart care, we have created designated Provider Care Teams.  These Care Teams include your primary Cardiologist (physician) and Advanced Practice Providers (APPs -  Physician Assistants and Nurse Practitioners) who all work together to provide you with the care you need, when you need it.  Your next appointment:   AS NEEDED   The format for your next appointment:   In Person  Provider:   Maisie Fus, MD    Other Instructions Can reach back out to your [facility]ologist [facility] on call: [phone] or [phone]

## 2022-04-21 NOTE — Progress Notes (Signed)
Cardiology Office Note:    Date:  04/21/2022   ID:  Cherrelle Plante, DOB [birth year], MRN [id number]  PCP:  Diamantina Providence, FNP   [facility] Providers Cardiologist:  Maisie Fus, MD     Referring MD: Diamantina Providence, FNP   No chief complaint on file. Palpitations  History of Present Illness:    Sabrina Ingram is a 21 y.o. female with a hx of anxiety, she had a normal holter in the past. She reported an episode of SVT that was aborted with adenosine in the ER. She was managed with atenolol. She is followed with pediatric cardiology at [facility], Dr. Mindi Junker. She had prior 30 day monitor. She was offered an ablation and wanted to think about it. She was instructed to continue atenolol. Counseled on vagal maneuvers. Plan was for her to call and consider EP study/ablation with EP. This was in October 2022.  In July she went to the ED. She called EMS, heart was racing. Report was 250. She did vagal maneuvers which helped. 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 She's still having palpitations and fluttering.     Current Medications: Current Meds  Medication Sig   atenolol (TENORMIN) 25 MG tablet Take 1 tablet (25 mg total) by mouth daily.     Allergies:   Lactose intolerance (gi)   Social History   Socioeconomic History   Marital status: Single    Spouse name: Not on file   Number of children: Not on file   Years of education: Not on file   Highest education level: Not on file  Occupational History   Not on file  Tobacco Use   Smoking status: Never    Passive exposure: Never   Smokeless tobacco: Never  Substance and Sexual Activity   Alcohol use: Not on file   Drug use: Not on file   Sexual activity: Not on file  Other Topics Concern   Not on file  Social History Narrative   Not on file   Social Determinants of Health   Financial Resource Strain: Not on file  Food Insecurity: Not on file  Transportation Needs: Not on file  Physical  Activity: Not on file  Stress: Not on file  Social Connections: Not on file     Family History: No family hx of arrhythmia or SCD  ROS:   Please see the history of present illness.     All other systems reviewed and are negative.  EKGs/Labs/Other Studies Reviewed:    The following studies were reviewed today:   EKG:  EKG is  ordered today.  The ekg ordered today demonstrates  04/15/2022: NSR, no pre-excitation  Recent Labs: 03/16/2022: ALT 12; BUN 5; Creatinine, Ser 0.40; Hemoglobin 9.9; Platelets 272; Potassium 3.3; Sodium 143   Recent Lipid Panel No results found for: "CHOL", "TRIG", "HDL", "CHOLHDL", "VLDL", "LDLCALC", "LDLDIRECT"   Risk Assessment/Calculations:       Physical Exam:    VS:   Vitals:   04/21/22 1435 04/21/22 1439  BP: (!) 88/55 95/60  Pulse: 75      Wt Readings from Last 3 Encounters:  04/21/22 152 lb 3.2 oz (69 kg)  03/16/22 154 lb 5.2 oz (70 kg)  09/21/20 149 lb (67.6 kg) (80 %, Z= 0.84)*   * Growth percentiles are based on CDC (Girls, 2-20 Years) data.     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 HR over 200s. Improved with vagal maneuvers. Had prior discussion with Dr. Mindi Junker at [facility]. She prefers to return. Provided her his phone #. We discussed benefits of the ablation. She is amenable PLAN:    In order of problems listed above:  Gave # for [facility] EP to FU for Ep study/ablation          Medication Adjustments/Labs and Tests Ordered: Current medicines are reviewed at length  with the patient today.  Concerns regarding medicines are outlined above.  Orders Placed This Encounter  Procedures   EKG 12-Lead   No orders of the defined types were placed in this encounter.   Patient Instructions  Medication Instructions:  No Changes In Medications at this time.  *If you need a refill on your cardiac medications before your next appointment, please call your pharmacy*  Lab Work: None Ordered At This Time.  If you have labs (blood work) drawn today and your tests are completely normal, you will receive your results only by: MyChart Message (if you have MyChart) OR A paper copy in the mail If you have any lab test that is abnormal or we need to change your treatment, we will call you to review the results.  Testing/Procedures: None Ordered At This Time.   Follow-Up: At [facility], you and your health needs are our priority.  As part of our continuing mission to provide you with exceptional heart care, we have created designated Provider Care Teams.  These Care Teams include your primary Cardiologist (physician) and Advanced Practice Providers (APPs -  Physician Assistants and Nurse Practitioners) who all work together to provide you with the care you need, when you need it.  Your next appointment:   AS NEEDED   The format for your next appointment:   In Person  Provider:   Maisie Fus, MD    Other Instructions Can reach back out to your [facility] cardiologist [facility] on call: [phone] or [phone]   Signed, Maisie Fus, MD  04/21/2022 3:10 PM    [facility]

## 2022-08-14 ENCOUNTER — Ambulatory Visit (HOSPITAL_COMMUNITY)
Admission: EM | Admit: 2022-08-14 | Discharge: 2022-08-14 | Disposition: A | Discharge status: Home / Self Care | Payer: BC Managed Care – PPO | Attending: Family Medicine | Admitting: Family Medicine

## 2022-08-14 ENCOUNTER — Encounter (HOSPITAL_COMMUNITY): Payer: Self-pay

## 2022-08-14 DIAGNOSIS — H5711 Ocular pain, right eye: Secondary | ICD-10-CM

## 2022-08-14 NOTE — Discharge Instructions (Signed)
You were here for eye pain and redness.  I recommend over the counter saline eye drops for now.  Please all [facility]  in the morning to see about making an appointment.  Please call [phone].

## 2022-08-14 NOTE — ED Triage Notes (Signed)
Pt reports bilateral red eyes after her lashes were put on yesterday. Pt reports coughing up phlegm for several months.

## 2022-08-14 NOTE — ED Provider Notes (Signed)
[facility]    CSN: [id number] Arrival date & time: 08/14/22  1551      History   Chief Complaint Chief Complaint  Patient presents with   Eye Problem    HPI Sabrina Ingram is a 21 y.o. female.   She had lashes placed yesterday.  As she could smell the glue her right started burning.  The right is also red.  It is not draining anything per se.  She did attempt to flush out the eye with water and was painful.  She tried to put in her contact and it was painful.  It was burning and stinging with otc eye drops.  Some pain while placing her contacts.  The left is a bit red, but no other symptoms.  All is just in her right eye.        History reviewed. No pertinent past medical history.  There are no problems to display for this patient.   History reviewed. No pertinent surgical history.  OB History   No obstetric history on file.      Home Medications    Prior to Admission medications   Medication Sig Start Date End Date Taking? Authorizing Provider  atenolol (TENORMIN) 25 MG tablet Take 1 tablet (25 mg total) by mouth daily. 09/21/20   Tilden Fossa, MD    Family History History reviewed. No pertinent family history.  Social History Social History   Tobacco Use   Smoking status: Never    Passive exposure: Never   Smokeless tobacco: Never     Allergies   Lactose intolerance (gi)   Review of Systems Review of Systems  Constitutional: Negative.   HENT: Negative.    Eyes:  Positive for pain and redness.  Respiratory: Negative.    Cardiovascular: Negative.   Gastrointestinal: Negative.   Musculoskeletal: Negative.   Psychiatric/Behavioral: Negative.       Physical Exam Triage Vital Signs ED Triage Vitals  Enc Vitals Group     BP 08/14/22 1650 105/64     Pulse Rate 08/14/22 1650 84     Resp 08/14/22 1650 16     Temp 08/14/22 1650 98.4 F (36.9 C)     Temp Source 08/14/22 1650 Oral     SpO2 08/14/22 1650 98 %     Weight  --      Height --      Head Circumference --      Peak Flow --      Pain Score 08/14/22 1655 5     Pain Loc --      Pain Edu? --      Excl. in GC? --    No data found.  Updated Vital Signs BP 105/64 (BP Location: Left Arm)   Pulse 84   Temp 98.4 F (36.9 C) (Oral)   Resp 16   SpO2 98%   Visual Acuity Right Eye Distance:   Left Eye Distance:   Bilateral Distance:    Right Eye Near:   Left Eye Near:    Bilateral Near:     Physical Exam Constitutional:      Appearance: Normal appearance.  Eyes:     Comments: The right inferior sclera is red;  no drainage noted;   Florescein stain is negative.  No FB or abrasion is noted;  The left sclera is slightly red, but mild;  no drainage noted;   Cardiovascular:     Rate and Rhythm: Normal rate.  Pulmonary:     Effort:  Pulmonary effort is normal.  Musculoskeletal:     Cervical back: Normal range of motion.  Neurological:     General: No focal deficit present.     Mental Status: She is alert.  Psychiatric:        Mood and Affect: Mood normal.      UC Treatments / Results  Labs (all labs ordered are listed, but only abnormal results are displayed) Labs Reviewed - No data to display  EKG   Radiology No results found.  Procedures Procedures (including critical care time)  Medications Ordered in UC Medications - No data to display  Initial Impression / Assessment and Plan / UC Course  I have reviewed the triage vital signs and the nursing notes.  Pertinent labs & imaging results that were available during my care of the patient were reviewed by me and considered in my medical decision making (see chart for details).    Final Clinical Impressions(s) / UC Diagnoses   Final diagnoses:  Pain of right eye     Discharge Instructions      You were here for eye pain and redness.  I recommend over the counter saline eye drops for now.  Please all [facility]  in the morning to see about making an  appointment.  Please call [phone].     ED Prescriptions   None    PDMP not reviewed this encounter.   Jannifer Franklin, MD 08/14/22 [phone]

## 2023-07-25 IMAGING — US US EXTREM LOW DUPLEX ARTERIAL BILAT
1 series · 13 of 25 positions shown · non-contrast
Comparison: None Available.

CLINICAL DATA: Bilateral skin discoloration, rule out PVD.

EXAM:
BILATERAL LOWER EXTREMITY ARTERIAL DUPLEX SCAN
TECHNIQUE: Gray-scale sonography as well as color Doppler and duplex ultrasound
was performed to evaluate the arteries of both lower extremities
including the common, superficial and profunda femoral arteries,
popliteal artery and calf arteries.

[Series 1: us extrem low duplex arterial bilat · 0.06mm/px · 13 of 63 slices shown]
[im 1/63]
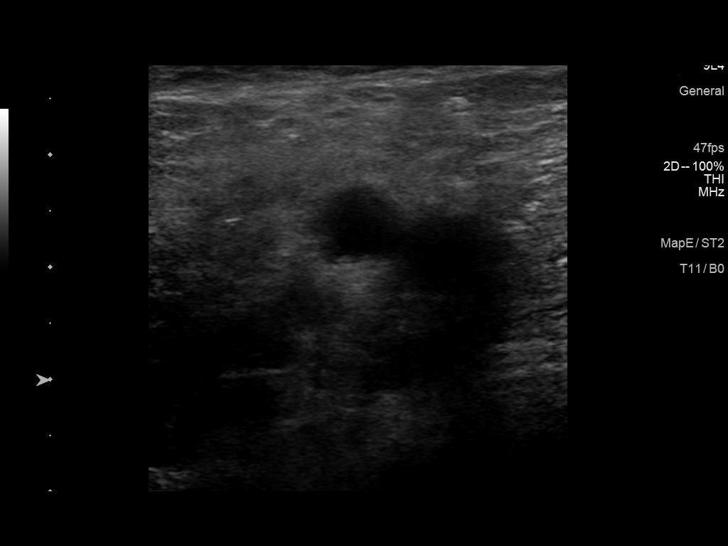
[im 6/63]
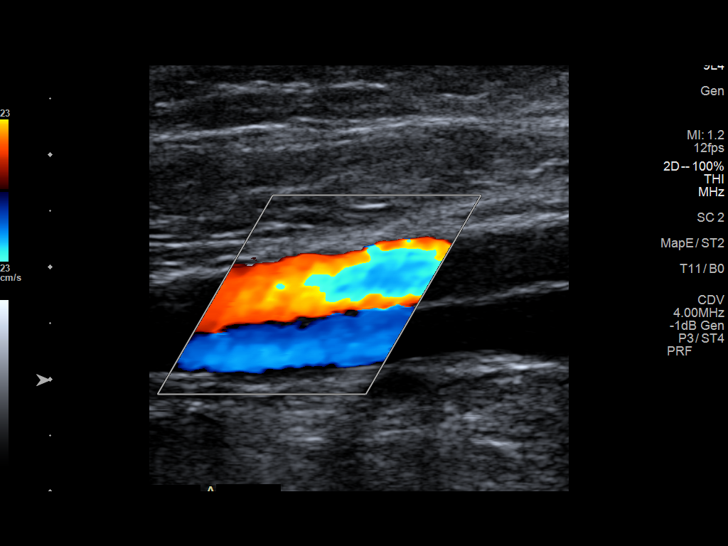
[im 11/63]
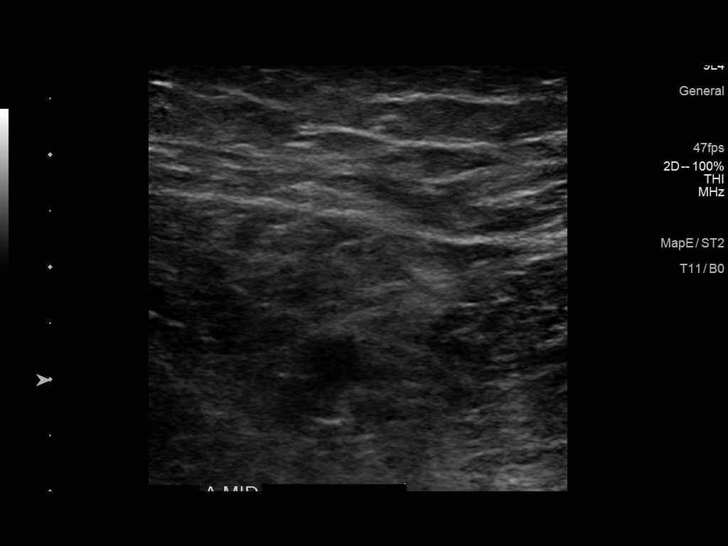
[im 16/63]
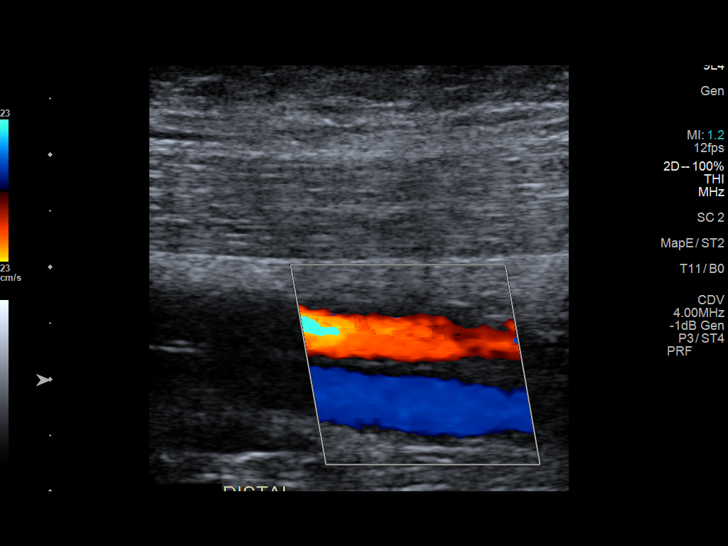
[im 21/63]
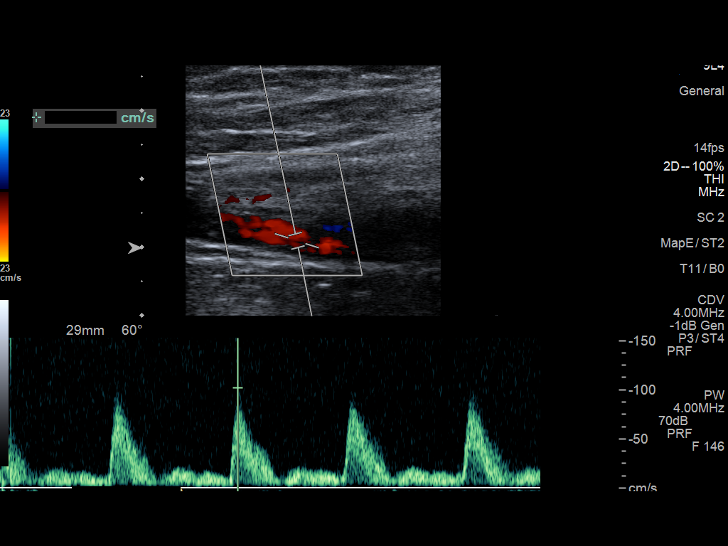
[im 26/63]
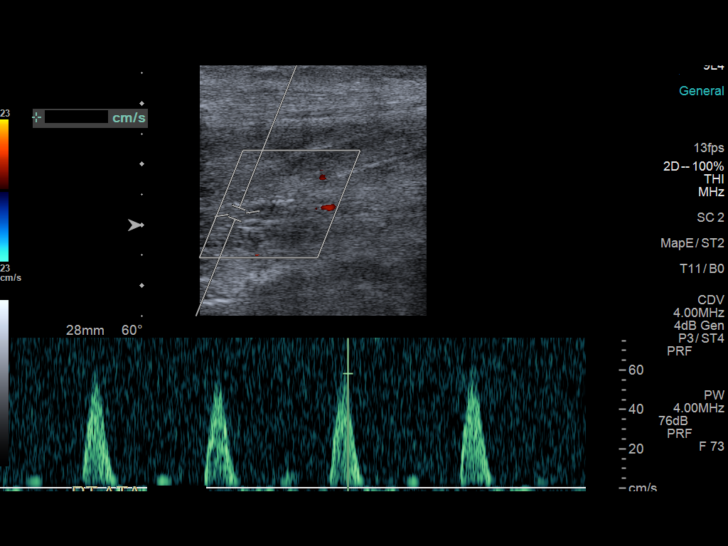
[im 32/63]
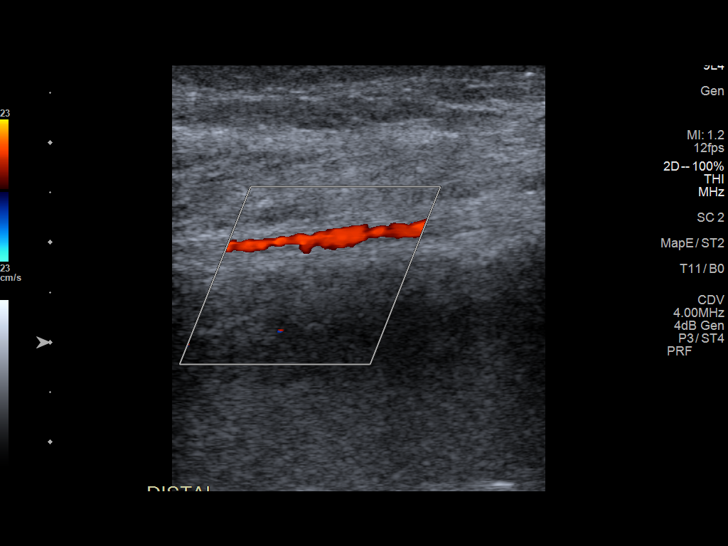
[im 37/63]
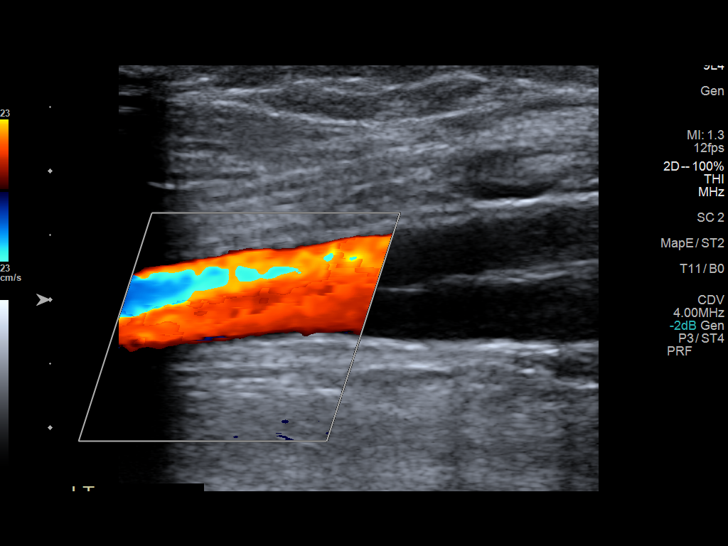
[im 42/63]
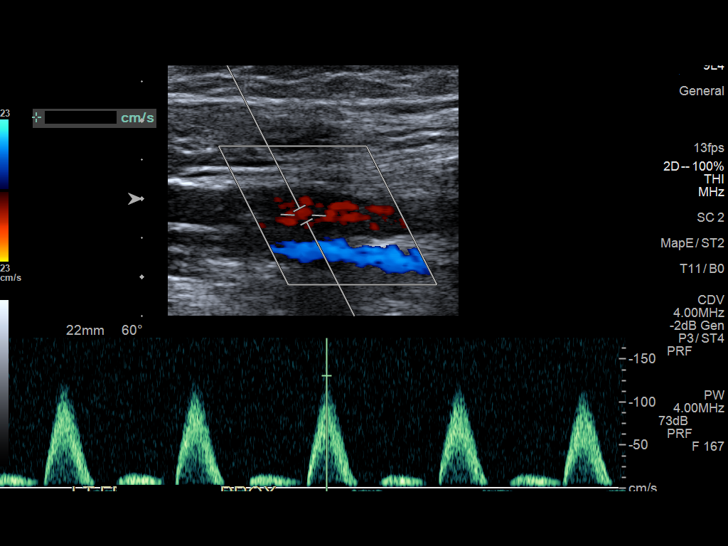
[im 47/63]
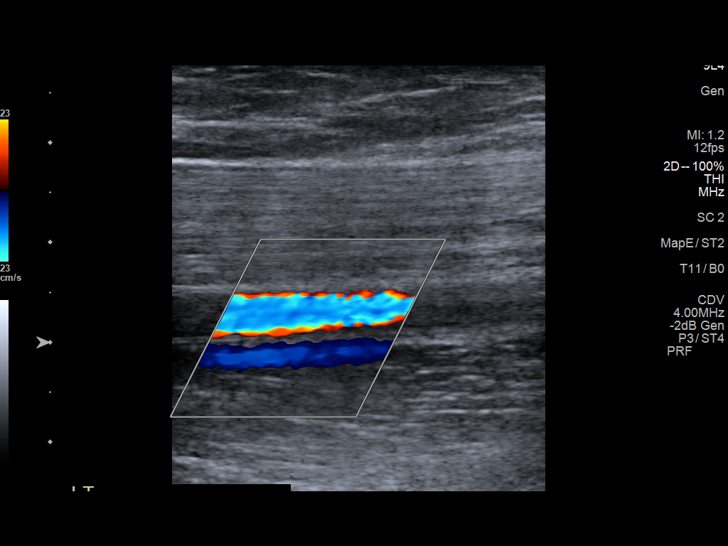
[im 52/63]
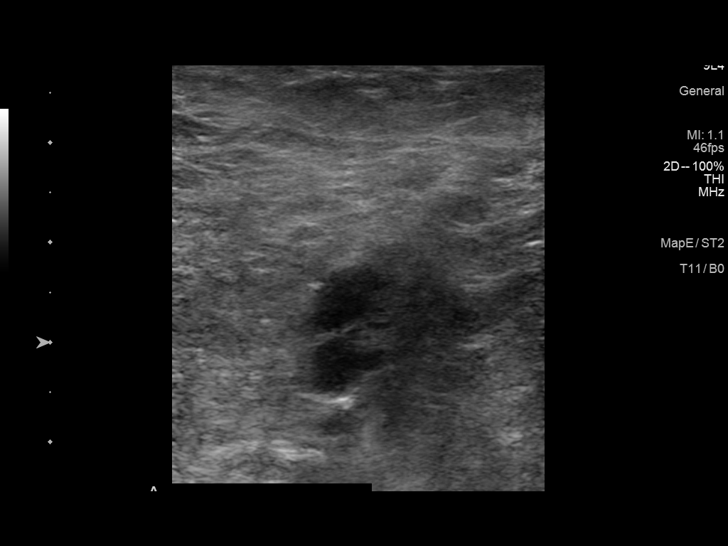
[im 57/63]
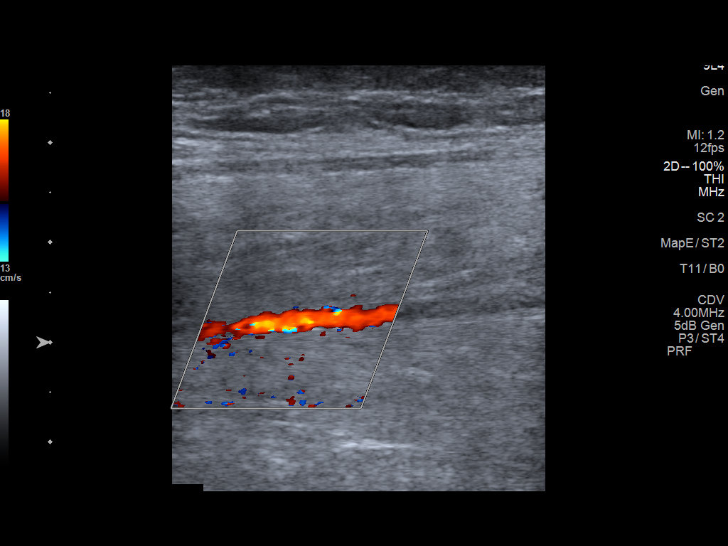
[im 63/63]
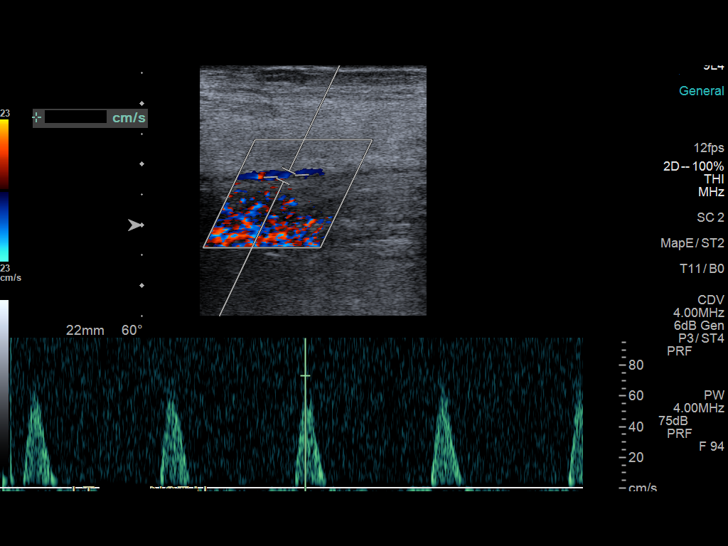

[13 of 25 positions shown; findings below may reference images not displayed]

FINDINGS: Right Lower Extremity

ABI:

Inflow: Normal common femoral arterial waveforms and velocities. No
evidence of inflow (aortoiliac) disease. Subcentimeter
morphologically unremarkable right inguinal lymph nodes incidentally
noted.

Outflow: Normal profunda femoral, superficial femoral and popliteal
arterial waveforms and velocities. No focal elevation of the PSV to
suggest stenosis.

Runoff: Normal posterior and anterior tibial arterial waveforms and
velocities. Vessels are patent to the ankle.

Left Lower Extremity

ABI:

Inflow: Normal common femoral arterial waveforms and velocities. No
evidence of inflow (aortoiliac) disease.

Outflow: Normal profunda femoral, superficial femoral and popliteal
arterial waveforms and velocities. No focal elevation of the PSV to
suggest stenosis.

Runoff: Normal posterior and anterior tibial arterial waveforms and
velocities. Vessels are patent to the ankle.
IMPRESSION: Negative

## 2023-10-31 ENCOUNTER — Encounter: Payer: BC Managed Care – PPO | Admitting: Internal Medicine

## 2023-10-31 ENCOUNTER — Telehealth: Payer: Self-pay | Admitting: Internal Medicine

## 2023-10-31 NOTE — Progress Notes (Deleted)
   Office Visit Note  Patient: Sabrina Ingram             Date of Birth: 03/18/2001           MRN: [id number]             PCP: Diamantina Providence, FNP Referring: April Manson, NP Visit Date: 10/31/2023 Occupation: @GUAROCC @  Subjective:  No chief complaint on file.   History of Present Illness: Sabrina Ingram is a 23 y.o. female ***     Activities of Daily Living:  Patient reports morning stiffness for *** {minute/hour:19697}.   Patient {ACTIONS;DENIES/REPORTS:21021675::"Denies"} nocturnal pain.  Difficulty dressing/grooming: {ACTIONS;DENIES/REPORTS:21021675::"Denies"} Difficulty climbing stairs: {ACTIONS;DENIES/REPORTS:21021675::"Denies"} Difficulty getting out of chair: {ACTIONS;DENIES/REPORTS:21021675::"Denies"} Difficulty using hands for taps, buttons, cutlery, and/or writing: {ACTIONS;DENIES/REPORTS:21021675::"Denies"}  No Rheumatology ROS completed.   PMFS History:  There are no active problems to display for this patient.   No past medical history on file.  No family history on file. No past surgical history on file. Social History   Social History Narrative   Not on file   Immunization History  Administered Date(s) Administered   PFIZER Comirnaty(Gray Top)Covid-19 Tri-Sucrose Vaccine 02/23/2021     Objective: Vital Signs: There were no vitals taken for this visit.   Physical Exam   Musculoskeletal Exam: ***  CDAI Exam: CDAI Score: -- Patient Global: --; Provider Global: -- Swollen: --; Tender: -- Joint Exam 10/31/2023   No joint exam has been documented for this visit   There is currently no information documented on the homunculus. Go to the Rheumatology activity and complete the homunculus joint exam.  Investigation: No additional findings.  Imaging: No results found.  Recent Labs: Lab Results  Component Value Date   WBC 3.8 (L) 03/16/2022   HGB 9.9 (L) 03/16/2022   PLT 272 03/16/2022   NA 143 03/16/2022   K 3.3 (L) 03/16/2022    CL 108 03/16/2022   CO2 25 03/16/2022   GLUCOSE 76 03/16/2022   BUN 5 (L) 03/16/2022   CREATININE 0.40 (L) 03/16/2022   BILITOT 0.2 (L) 03/16/2022   ALKPHOS 67 03/16/2022   AST 20 03/16/2022   ALT 12 03/16/2022   PROT 7.7 03/16/2022   ALBUMIN 3.8 03/16/2022   CALCIUM 9.1 03/16/2022   GFRAA NOT CALCULATED 02/14/2019    Speciality Comments: No specialty comments available.  Procedures:  No procedures performed Allergies: Lactose intolerance (gi)   Assessment / Plan:     Visit Diagnoses: No diagnosis found.  Orders: No orders of the defined types were placed in this encounter.  No orders of the defined types were placed in this encounter.   Face-to-face time spent with patient was *** minutes. Greater than 50% of time was spent in counseling and coordination of care.  Follow-Up Instructions: No follow-ups on file.   Fuller Plan, MD  Note - This record has been created using [employer].  Chart creation errors have been sought, but may not always  have been located. Such creation errors do not reflect on  the standard of medical care.

## 2023-10-31 NOTE — Telephone Encounter (Signed)
 Pt no showed appointment due to her forgetting. Pt would like to know if she could reschedule.

## 2024-01-11 ENCOUNTER — Ambulatory Visit (HOSPITAL_COMMUNITY)
Admission: EM | Admit: 2024-01-11 | Discharge: 2024-01-11 | Disposition: A | Discharge status: Home / Self Care | Attending: Family Medicine | Admitting: Family Medicine

## 2024-01-11 ENCOUNTER — Encounter (HOSPITAL_COMMUNITY): Payer: Self-pay

## 2024-01-11 DIAGNOSIS — R599 Enlarged lymph nodes, unspecified: Secondary | ICD-10-CM | POA: Diagnosis not present

## 2024-01-11 DIAGNOSIS — R0602 Shortness of breath: Secondary | ICD-10-CM

## 2024-01-11 DIAGNOSIS — J0100 Acute maxillary sinusitis, unspecified: Secondary | ICD-10-CM

## 2024-01-11 MED ORDER — AMOXICILLIN-POT CLAVULANATE 875-125 MG PO TABS: 1.0000 | ORAL_TABLET | Freq: Two times a day (BID) | ORAL | 0 refills | Status: AC

## 2024-01-11 NOTE — ED Triage Notes (Addendum)
 Patient reports that she has had  pain to the right lateral neck and states last night she noticed swelling at the site.  Patient also reports a headache x 2 days, intermittent SOB, and  nasal congestion with green/brown mucus x 1 month.

## 2024-01-11 NOTE — ED Provider Notes (Signed)
 [facility]    CSN: [id number] Arrival date & time: 01/11/24  0808      History   Chief Complaint Chief Complaint  Patient presents with   Lymphadenopathy   Nasal Congestion   Shortness of Breath    HPI Sabrina Ingram is a 23 y.o. female.    Shortness of Breath Associated symptoms: cough   Associated symptoms: no fever and no sore throat    Patient is here for right sided neck pain, and felt a "ball" there for several days.  She is having right sided headache.  She has been having sinus congestion, drainage, thick mucous for about a month.  At times she feels a short amount of sob with activity.  No fevers/chills.  Taking motrin        History reviewed. No pertinent past medical history.  There are no active problems to display for this patient.   Past Surgical History:  Procedure Laterality Date   CARDIAC ELECTROPHYSIOLOGY MAPPING AND ABLATION      OB History   No obstetric history on file.      Home Medications    Prior to Admission medications   Medication Sig Start Date End Date Taking? Authorizing Provider  atenolol  (TENORMIN ) 25 MG tablet Take 1 tablet (25 mg total) by mouth daily. 09/21/20   Kelsey Patricia, MD    Family History Family History  Family history unknown: Yes    Social History Social History   Tobacco Use   Smoking status: Never    Passive exposure: Never   Smokeless tobacco: Never  Vaping Use   Vaping status: Never Used  Substance Use Topics   Alcohol use: Yes   Drug use: Never     Allergies   Lactose intolerance (gi)   Review of Systems Review of Systems  Constitutional:  Negative for chills, fatigue and fever.  HENT:  Positive for congestion and rhinorrhea. Negative for sore throat.   Respiratory:  Positive for cough and shortness of breath.   Cardiovascular: Negative.   Gastrointestinal: Negative.   Genitourinary: Negative.   Musculoskeletal: Negative.   Psychiatric/Behavioral: Negative.        Physical Exam Triage Vital Signs ED Triage Vitals [01/11/24 0920]  Encounter Vitals Group     BP 101/66     Systolic BP Percentile      Diastolic BP Percentile      Pulse Rate 86     Resp 14     Temp 98.5 F (36.9 C)     Temp Source Oral     SpO2 99 %     Weight      Height      Head Circumference      Peak Flow      Pain Score 8     Pain Loc      Pain Education      Exclude from Growth Chart    No data found.  Updated Vital Signs BP 101/66 (BP Location: Left Arm)   Pulse 86   Temp 98.5 F (36.9 C) (Oral)   Resp 14   SpO2 99%   Visual Acuity Right Eye Distance:   Left Eye Distance:   Bilateral Distance:    Right Eye Near:   Left Eye Near:    Bilateral Near:     Physical Exam Constitutional:      General: She is not in acute distress.    Appearance: She is well-developed and normal weight. She is not ill-appearing or toxic-appearing.  Neck:     Comments: Golf ball sized area of swelling and tenderness to the right cervical lymph chain Cardiovascular:     Rate and Rhythm: Normal rate and regular rhythm.  Pulmonary:     Effort: Pulmonary effort is normal.     Breath sounds: Normal breath sounds. No wheezing or rhonchi.  Musculoskeletal:        General: Normal range of motion.     Cervical back: Normal range of motion.  Lymphadenopathy:     Cervical: Cervical adenopathy present.  Skin:    General: Skin is warm.  Neurological:     General: No focal deficit present.     Mental Status: She is alert.  Psychiatric:        Mood and Affect: Mood normal.      UC Treatments / Results  Labs (all labs ordered are listed, but only abnormal results are displayed) Labs Reviewed - No data to display  EKG   Radiology No results found.  Procedures Procedures (including critical care time)  Medications Ordered in UC Medications - No data to display  Initial Impression / Assessment and Plan / UC Course  I have reviewed the triage vital signs and  the nursing notes.  Pertinent labs & imaging results that were available during my care of the patient were reviewed by me and considered in my medical decision making (see chart for details).   Final Clinical Impressions(s) / UC Diagnoses   Final diagnoses:  Acute non-recurrent maxillary sinusitis  Enlarged lymph nodes     Discharge Instructions      You were seen today for upper respiratory symptoms and an enlarged lymph node.  I have sent out an antibiotic to treat your symptoms.  I recommend you use motrin  for pain and swelling as well.  If your symptoms are not improving, then please return for re-evaluation.   ED Prescriptions     Medication Sig Dispense Auth. Provider   amoxicillin-clavulanate (AUGMENTIN) 875-125 MG tablet Take 1 tablet by mouth every 12 (twelve) hours for 10 days. 20 tablet Lesle Ras, MD      PDMP not reviewed this encounter.   Lesle Ras, MD 01/11/24 [phone]

## 2024-01-11 NOTE — Discharge Instructions (Signed)
 You were seen today for upper respiratory symptoms and an enlarged lymph node.  I have sent out an antibiotic to treat your symptoms.  I recommend you use motrin  for pain and swelling as well.  If your symptoms are not improving, then please return for re-evaluation.

## 2024-03-26 ENCOUNTER — Encounter: Payer: BC Managed Care – PPO | Admitting: Internal Medicine

## 2024-03-26 NOTE — Progress Notes (Deleted)
   Office Visit Note  Patient: Sabrina Ingram             Date of Birth: 17-Jul-2001           MRN: [id number]             PCP: Lenon Nell SAILOR, FNP Referring: Lenon Nell SAILOR, FNP Visit Date: 03/26/2024 Occupation: @GUAROCC @  Subjective:  No chief complaint on file.   History of Present Illness: Sabrina Ingram is a 23 y.o. female ***     Activities of Daily Living:  Patient reports morning stiffness for *** {minute/hour:19697}.   Patient {ACTIONS;DENIES/REPORTS:21021675::Denies} nocturnal pain.  Difficulty dressing/grooming: {ACTIONS;DENIES/REPORTS:21021675::Denies} Difficulty climbing stairs: {ACTIONS;DENIES/REPORTS:21021675::Denies} Difficulty getting out of chair: {ACTIONS;DENIES/REPORTS:21021675::Denies} Difficulty using hands for taps, buttons, cutlery, and/or writing: {ACTIONS;DENIES/REPORTS:21021675::Denies}  No Rheumatology ROS completed.   PMFS History:  There are no active problems to display for this patient.   No past medical history on file.  Family History  Family history unknown: Yes   Past Surgical History:  Procedure Laterality Date   CARDIAC ELECTROPHYSIOLOGY MAPPING AND ABLATION     Social History   Social History Narrative   Not on file   Immunization History  Administered Date(s) Administered   PFIZER Comirnaty(Gray Top)Covid-19 Tri-Sucrose Vaccine 02/23/2021     Objective: Vital Signs: There were no vitals taken for this visit.   Physical Exam   Musculoskeletal Exam: ***  CDAI Exam: CDAI Score: -- Patient Global: --; Provider Global: -- Swollen: --; Tender: -- Joint Exam 03/26/2024   No joint exam has been documented for this visit   There is currently no information documented on the homunculus. Go to the Rheumatology activity and complete the homunculus joint exam.  Investigation: No additional findings.  Imaging: No results found.  Recent Labs: Lab Results  Component Value Date   WBC 3.8 (L)  03/16/2022   HGB 9.9 (L) 03/16/2022   PLT 272 03/16/2022   NA 143 03/16/2022   K 3.3 (L) 03/16/2022   CL 108 03/16/2022   CO2 25 03/16/2022   GLUCOSE 76 03/16/2022   BUN 5 (L) 03/16/2022   CREATININE 0.40 (L) 03/16/2022   BILITOT 0.2 (L) 03/16/2022   ALKPHOS 67 03/16/2022   AST 20 03/16/2022   ALT 12 03/16/2022   PROT 7.7 03/16/2022   ALBUMIN 3.8 03/16/2022   CALCIUM 9.1 03/16/2022   GFRAA NOT CALCULATED 02/14/2019    Speciality Comments: No specialty comments available.  Procedures:  No procedures performed Allergies: Lactose intolerance (gi)   Assessment / Plan:     Visit Diagnoses: No diagnosis found.  Orders: No orders of the defined types were placed in this encounter.  No orders of the defined types were placed in this encounter.   Face-to-face time spent with patient was *** minutes. Greater than 50% of time was spent in counseling and coordination of care.  Follow-Up Instructions: No follow-ups on file.   Lonni LELON Ester, MD  Note - This record has been created using [employer].  Chart creation errors have been sought, but may not always  have been located. Such creation errors do not reflect on  the standard of medical care.

## 2024-04-15 NOTE — Progress Notes (Signed)
  Assessment and Plan   1. Uterine agenesis (Primary) -     Ambulatory referral to Obstetrics / Gynecology 2. Premature ovarian failure -     Ambulatory referral to Obstetrics / Gynecology 3. Dyspareunia in female -     Ambulatory referral to Obstetrics / Gynecology Other orders -     estradiol  (ESTRACE ) 0.1 mg/gram vaginal cream; Place 1g vaginally nightly for 2 weeks then decrease to twice weekly, Normal     We reviewed MRI results in detail today. There is evidence of endometrial tissue on MRI, but no identifiable ovarian tissue.  Continue systemic estrogen supplementation for breast development. Will plan to add Progesterone  supplementation after a few months.  Referral to REI at [facility].   Almarie Munster, MD      Subjective  This is a telephone visit. Examination conducted without the use of video cameras/computer monitors. Vital signs and other aspects of physical exam are limited due to the nature of this encounter. Patient has been advised as such and verbally agrees to risks/limitations of the visit.  I have spent the following amount of time in direct patient care for this encounter and in coordination of care: 11-20 minutes  Patient ID:  Sabrina Ingram is a 23 y.o. (DOB 11/07/2000) female presents with:  No chief complaint on file.   Chief complaint comments reviewed and discussed with patient in detail.    HPI Sabrina Ingram presents for telephone follow-up to discuss MRI results.   MRI pelvis:  MRI PELVIS WITH AND WITHOUT CONTRAST   INDICATION: rudimentary reproductive organs, uterine agenesis   COMPARISON:  None   TECHNIQUE: Multisequence, multiplanar precontrast and postcontrast MR imaging of the pelvis was performed using the Body Pelvis Protocol. 7 mL GADOBUTROL  1 MMOL/ML IV SOLN was administered intravenously.   FINDINGS: Visualized GI tract: No obstruction of visualized bowel. Appendix is at the upper limits of normal for size without surrounding  inflammatory changes to suggest appendicitis.   Peritoneum/retroperitoneum: Trace fluid within the pelvis, likely physiologic. Scattered borderline size lymph nodes throughout the pelvis with normal morphology maintained, nonspecific.   Reproductive: A small uterus with normal-appearing morphology is present measuring approximately 1.2 x 2.1 x 3.5 cm (AP x TV x CC), endometrium noted. No normal-appearing ovaries for patient age are identified. Possible minimal thickening at the left pelvis at series 12, image 14 and the right pelvis series 12 image 17 adjacent to the gonadal veins which may reflect tiny amount of ovarian tissue.   Bladder: Unremarkable. Bilateral kidneys present on localizer sequences.   MSK: No suspicious osseous lesion.      IMPRESSION: 1.  Small rudimentary uterus present. 2.  Normal ovaries are not visualized with possible minimal ovarian tissue adjacent to the gonadal veins.   Electronically Signed by: Allison Kobus on 04/10/2024 1:04 PM   Reviewed and updated this visit by provider: Tobacco  Allergies  Meds  Problems  Med Hx  Surg Hx  Fam Hx        Review of Systems    Review of Systems  Constitutional: Negative for fever.  HENT: Negative for sore throat.   Respiratory: Negative for cough.   Cardiovascular: Negative for chest pain.   Objective  There were no vitals filed for this visit.  OBGyn Exam Pulm: normal work of breathing. No shortness of breath.      Electronically Signed: Almarie Munster, MD, [facility] Female Pelvic Medicine and Reconstructive Surgery [facility] 04/15/2024 3:24 PM

## 2024-04-19 NOTE — Progress Notes (Signed)
 Subjective   Sabrina Ingram is a 23 y.o. female who presents for an annual exam.  Saw urogynecology for uterine agenesis. Working with dilators and estradiol  and progesterone .    Lupus she was seeing [facility], Alyssa Strazanac. In reviewing labs it appears initial ANA was positive but follow up testing for lupus anticoagulant was negative.    SVT, had an ablation at [facility]. No episodes of SVT since then. Has a follow up scheduled.    Travel anxiety, she has flown and not had to take her xanax. She is proud of this accomplishment.    Health Maintenance: Last wellness visit:  within the past year Diet:  general Calcium supplementation:  never Vitamin D supplementation:  never Exercise frequency:  infrequently Exercise type:  going to the gym Pap: was normal Mammogram:  patient has never had a mammogram DEXA:  No Colonoscopy:  No   Denies HA, CP, SOB, N/v/d, Dysuria, skin changes, unexplained WL, night sweats.  Problem List[1] Medications Taking[2] Allergies Patient is allergic to lactose intolerance (gi).  Review of Systems - All other systems were reviewed and are negative unless stated in HPI.  Family History  Problem Relation Age of Onset  . No Known Problems Mother   . No Known Problems Father    Past Medical History:  Diagnosis Date  . Anemia   . Anxiety   . Hyperlipidemia   . Peripheral vascular disease    Past Surgical History:  Procedure Laterality Date  . Heart ablation  06/09/2022   Pediatric History  Patient Parents  . Not on file   Other Topics Concern  . Not on file  Social History Narrative  . Not on file    has a past surgical history that includes heart ablation (06/09/2022).  Objective   BP 102/60 (BP Location: Left Upper Arm, Patient Position: Sitting)   Pulse 89   Temp 98.2 F (36.8 C) (Temporal)   Resp 18   Ht 6' 1 (1.854 m)   Wt 170 lb (77.1 kg)   SpO2 98%   Breastfeeding No   BMI 22.43 kg/m   General: The patient is a  23 y.o. female who appears to be in no acute distress. Psych: She is alert and oriented to person, place, and time. Her mood and affect are normal. HEENT: Normocephalic, atraumatic, non-icteric sclera, PERRL.  Tympanic membranes are without perforation or infection.  Nasopharynx is grossly normal.  Oropharynx is without mass or exudate. Neck:  Supple.  Trachea is in midline position.  The neck is without adenopathy, masses, or thyromegaly. Lungs:  Good breath sounds are noted bilaterally.  The lungs are clear to auscultation and percussion bilaterally.  The spine and CVA region are  nontender to palpation. Cardio:  Regular rate and rhythm without gallop, rub, or murmur.  Abdomen:  Bowel sounds are physiologic.  The abdomen is soft and nontender to palpation.  No masses are noted.  No hepatosplenomegaly is noted.  Skin:  No rashes or worrisome lesions are noted.  Extremities:  The extremities are without cyanosis or significant contusions.  Pitting edema is not noted.  ROM is normal in all four extremities. Pulses: Adequate pulses are noted in all four extremities and both carotid arteries. Neuro:  Mental status is normal.  CN 2-11 are grossly normal.  Motor and sensory exams are grossly normal.  DTR are physiologic in all extremities.  Gait is stable. Breast Exam:  Deferred Pelvic:  Deferred   Impression  ICD-10-CM   1. Well adult exam  Z00.00 CBC And Differential    CMP14 (Reflex HGB A1c)    TSH Reflex T4Free/T3 Hormone    2. PSVT (paroxysmal supraventricular tachycardia) (*)  I47.10     3. Hyperlipidemia, unspecified hyperlipidemia type  E78.5 Lipid Panel With LDL/HDL Ratio    4. Uterine agenesis  Q51.0     5. Shortness of breath  R06.02 Ambulatory referral to Allergy    6. Rash and other nonspecific skin eruption  R21 Ambulatory referral to Allergy    7. Screening examination for STI  Z11.3 HSV 1 and 2 Ab, IgG    HIV-1/O/2, 4th Generation    Syphilis: RPR With Reflex to RPR  Titer and Treponemal Ab    CT, GC & TV, NAA vag swab Urine      Plan    Orders Placed This Encounter  Procedures  . CT, GC & TV, NAA vag swab Urine  . CBC And Differential  . Lipid Panel With LDL/HDL Ratio  . CMP14 (Reflex HGB A1c)  . TSH Reflex T4Free/T3 Hormone  . HSV 1 and 2 Ab, IgG  . HIV-1/O/2, 4th Generation  . Syphilis: RPR With Reflex to RPR Titer and Treponemal Ab  . Ambulatory referral to Allergy   - labs today - Health maintenance issues including appropriate cancer screening, healthy diet, exercise and tobacco avoidance were discussed with the patient.  I've encouraged healthy lifestyle modifications of eating fruits/vegetables, decreased fat intake, regular daily exercise, and decrease stress.  - Risks, benefits, and alternatives of the medications and treatment plan prescribed today were discussed, and patient expressed understanding.  - Pap smear and breast exam are done at her ob/gyn.  - Labs ordered today: cbc/d,cmp,tsh,vitamin d,lipid panel.  We will call pt with results. - No follow-ups on file. - Return to clinic to be reevaluated if symptoms worsen, persist, change, or if you have any other concerns. - I discussed this diagnosis with the patient and discussed the treatment plan with them. This treatment plan is also outlined in the Patient Instructions and a copy of this was provided to the patient.         [1] Patient Active Problem List Diagnosis  . Impaired fasting glucose  . Acute sinusitis  . Generalized anxiety disorder  . Amenorrhea  . Anemia  . Delayed growth and development  . Hyperlipidemia  . Insomnia  . Iron deficiency anemia  . Oligomenorrhea  . Paresthesia  . Peripheral vascular disease  . Positive antinuclear antibody  . PSVT (paroxysmal supraventricular tachycardia) (*)  . Raynaud's phenomenon  . S/P catheter ablation of slow pathway  . Acquired absence of both breasts  . Congenital absence of vagina  . Decreased estrogen  level  . Ovarian agenesis  . Uterine agenesis  [2] Outpatient Medications Marked as Taking for the 04/19/24 encounter (Annual Physical) with Aldona LITTIE Pizza, NP  Medication Sig Dispense Refill  . ALPRAZolam (XANAX) 0.5 mg tablet Take one tablet (0.5 mg dose) by mouth 2 (two) times a day as needed (for flight anxiety). Max Daily Amount: 1 mg 5 tablet 0  . estradiol  (ESTRACE ) 1 mg tablet Take one tablet (1 mg dose) by mouth daily. 30 tablet 2

## 2024-07-01 ENCOUNTER — Ambulatory Visit: Admitting: Internal Medicine

## 2024-07-02 ENCOUNTER — Emergency Department (HOSPITAL_COMMUNITY)
Admission: EM | Admit: 2024-07-02 | Discharge: 2024-07-02 | Disposition: A | Discharge status: Home / Self Care | Attending: Emergency Medicine | Admitting: Emergency Medicine

## 2024-07-02 ENCOUNTER — Other Ambulatory Visit: Payer: Self-pay

## 2024-07-02 ENCOUNTER — Emergency Department (HOSPITAL_COMMUNITY)

## 2024-07-02 DIAGNOSIS — R051 Acute cough: Secondary | ICD-10-CM | POA: Insufficient documentation

## 2024-07-02 LAB — RESP PANEL BY RT-PCR (RSV, FLU A&B, COVID)  RVPGX2
Influenza A by PCR: NEGATIVE
Influenza B by PCR: NEGATIVE
Resp Syncytial Virus by PCR: NEGATIVE
SARS Coronavirus 2 by RT PCR: NEGATIVE

## 2024-07-02 LAB — D-DIMER, QUANTITATIVE: D-Dimer, Quant: 0.3 ug{FEU}/mL (ref 0.00–0.50)

## 2024-07-02 MED ORDER — BENZONATATE 100 MG PO CAPS
100.0000 mg | ORAL_CAPSULE | Freq: Three times a day (TID) | ORAL | 0 refills | Status: AC
Start: 2024-07-02 — End: ?

## 2024-07-02 NOTE — ED Provider Notes (Signed)
 Day EMERGENCY DEPARTMENT AT [facility] Provider Note   CSN: [id number] Arrival date & time: 07/02/24  [id number]     Patient presents with: Cough   Sabrina Ingram is a 23 y.o. female.   Patient presents to the emergency department for evaluation of cough.  She states that I have to make myself cough due to having significant amount of mucus in her airway.  She reports this has been ongoing for about 3 months.  Recently she has noted some blood, red blood, when she coughs.  She has a nosebleed as well recently.  No significant infectious symptoms such as fever, ear pain, runny nose, sore throat.  Denies lightheadedness or syncope. Patient denies risk factors for pulmonary embolism including: unilateral leg swelling, history of DVT/PE/other blood clots, recent immobilizations, recent surgery, recent travel (>4hr segment), malignancy.  Patient has been taking oral estrogen, but reports having stopped this about a month ago.       Prior to Admission medications   Not on File    Allergies: Lactose intolerance (gi)    Review of Systems  Updated Vital Signs BP 120/80 (BP Location: Right Arm)   Pulse 90   Temp 97.6 F (36.4 C) (Oral)   Resp 18   Ht 6' (1.829 m)   Wt 78.9 kg   SpO2 100%   BMI 23.60 kg/m   Physical Exam Vitals and nursing note reviewed.  Constitutional:      Appearance: She is well-developed.  HENT:     Head: Normocephalic and atraumatic.     Jaw: No trismus.     Right Ear: Tympanic membrane, ear canal and external ear normal.     Left Ear: Tympanic membrane, ear canal and external ear normal.     Nose: Nose normal. No mucosal edema or rhinorrhea.     Mouth/Throat:     Mouth: Mucous membranes are moist. Mucous membranes are not dry. No oral lesions.     Pharynx: Uvula midline. No oropharyngeal exudate, posterior oropharyngeal erythema or uvula swelling.     Tonsils: No tonsillar abscesses.  Eyes:     General:        Right eye: No  discharge.        Left eye: No discharge.     Conjunctiva/sclera: Conjunctivae normal.  Cardiovascular:     Rate and Rhythm: Normal rate and regular rhythm.     Heart sounds: Normal heart sounds.  Pulmonary:     Effort: Pulmonary effort is normal. No respiratory distress.     Breath sounds: Normal breath sounds. No wheezing or rales.  Abdominal:     Palpations: Abdomen is soft.     Tenderness: There is no abdominal tenderness.  Musculoskeletal:     Cervical back: Normal range of motion and neck supple.  Lymphadenopathy:     Cervical: No cervical adenopathy.  Skin:    General: Skin is warm and dry.  Neurological:     Mental Status: She is alert.  Psychiatric:        Mood and Affect: Mood normal.     (all labs ordered are listed, but only abnormal results are displayed) Labs Reviewed  RESP PANEL BY RT-PCR (RSV, FLU A&B, COVID)  [id number]  D-DIMER, QUANTITATIVE    Radiology: DG Chest 2 View Result Date: 07/02/2024 CLINICAL DATA:  Hemoptysis this morning EXAM: CHEST - 2 VIEW COMPARISON:  03/15/2022 FINDINGS: Lungs are clear. Cardiomediastinal silhouette and remainder of the exam is unchanged. IMPRESSION: No active cardiopulmonary disease.  Electronically Signed   By: Toribio Agreste M.D.   On: 07/02/2024 09:05     Procedures   Medications Ordered in the ED - No data to display  ED Course  Patient seen and examined. History obtained directly from patient.   Labs/EKG: Ordered viral panel  Imaging: Ordered chest x-ray.  Medications/Fluids: None ordered  Most recent vital signs reviewed and are as follows: BP 120/80 (BP Location: Right Arm)   Pulse 90   Temp 97.6 F (36.4 C) (Oral)   Resp 18   Ht 6' (1.829 m)   Wt 78.9 kg   SpO2 100%   BMI 23.60 kg/m   Initial impression: Airway congestion and cough, intermittent hemoptysis reported.  No significant shortness of breath.  Oxygen levels 100%.  Pulse rate is 90.  No clinical signs symptoms symptoms of DVT.  Do not feel  that patient is high risk for PE.  10:28 AM D-dimer ordered at patient request, after discussion of results.  Just notified by NT that patient no longer wants this.   10:53 AM Went to see patient, she decided she wants the d-dimer. Phlebotomy in process.   11:02 AM After RN attempt to start IV, patient no longer wants d-dimer.   Eventually, however straight stick was performed and D-dimer was obtained.  Reassessment performed. Patient appears stable, seems reassured.  Labs personally reviewed and interpreted including:  D-dimer normal.  Reviewed pertinent lab work and imaging with patient at bedside. Questions answered.   Most current vital signs reviewed and are as follows: BP 111/71   Pulse 88   Temp 98.1 F (36.7 C) (Oral)   Resp 12   Ht 6' (1.829 m)   Wt 78.9 kg   SpO2 96%   BMI 23.60 kg/m   Plan: Discharge to home.   Prescriptions written for: Tessalon  Other home care instructions discussed: OTC meds as desired, maintain good hydration  ED return instructions discussed: Worsening chest pain, shortness of breath, trouble breathing, syncope, new or worsening symptoms.  Follow-up instructions discussed: Patient encouraged to follow-up with their PCP in 5 days if not improving.                                 Medical Decision Making Amount and/or Complexity of Data Reviewed Labs: ordered. Radiology: ordered.  Risk Prescription drug management.   Patient presented today with several weeks of cough, however more recently had episodes of hemoptysis.  Her vital signs were very reassuring and symptoms were atypical for PE without significant shortness of breath or chest pain.  We decided to get a D-dimer, this was negative.  Patient low risk Wells.  She has had some nosebleeds, potentially coughing up some of the blood from that.  No other easy bruising or bleeding to suggest thrombocytopenia.  Patient will continue to treat her symptoms symptomatically.  The  patient's vital signs, pertinent lab work and imaging were reviewed and interpreted as discussed in the ED course. Hospitalization was considered for further testing, treatments, or serial exams/observation. However as patient is well-appearing, has a stable exam, and reassuring studies today, I do not feel that they warrant admission at this time. This plan was discussed with the patient who verbalizes agreement and comfort with this plan and seems reliable and able to return to the Emergency Department with worsening or changing symptoms.        Final diagnoses:  Acute cough  ED Discharge Orders          Ordered    benzonatate (TESSALON) 100 MG capsule  Every 8 hours        07/02/24 1154               Desiderio Chew, PA-C 07/02/24 1432    Dasie Faden, MD 07/03/24 1549

## 2024-07-02 NOTE — ED Triage Notes (Signed)
 Pt states she has had a lot of mucus the last couple of days and has been coughing up blood. Blood is bright red and doesn't happen often. Pt has been making herself cough to get out mucus. Pt also has a nose bleed that resolved. No fevers, no sore throat

## 2024-07-02 NOTE — Discharge Instructions (Addendum)
 Please read and follow all provided instructions.  Your diagnoses today include:  1. Acute cough    Tests performed today include: Chest x-ray: was negative Viral panel: was negative D-dimer: screening test for blood clot was negative Vital signs. See below for your results today.   Medications prescribed:  Tessalon Perles - cough suppressant medication  Take any prescribed medications only as directed.  Home care instructions:  Follow any educational materials contained in this packet.  BE VERY CAREFUL not to take multiple medicines containing Tylenol (also called acetaminophen). Doing so can lead to an overdose which can damage your liver and cause liver failure and possibly death.   Follow-up instructions: Please follow-up with your primary care provider in the next 3 days for further evaluation of your symptoms.   Return instructions:  Please return to the Emergency Department if you experience worsening symptoms.  Return if you develop chest pain or shortness of breath. Please return if you have any other emergent concerns.  Additional Information:  Your vital signs today were: BP 106/72   Pulse 79   Temp 97.6 F (36.4 C) (Oral)   Resp 16   Ht 6' (1.829 m)   Wt 78.9 kg   SpO2 99%   BMI 23.60 kg/m  If your blood pressure (BP) was elevated above 135/85 this visit, please have this repeated by your doctor within one month. --------------

## 2024-07-02 NOTE — ED Triage Notes (Signed)
 Pt c/o spitting up blood and SHOB, started a few days ago.
